# Patient Record
Sex: Female | Born: 1965 | Hispanic: No | Marital: Married | Smoking: Former smoker
Health system: Southern US, Community
[De-identification: ages and names within clinical notes are randomized; demographics above are authoritative.]

## PROBLEM LIST (undated history)

## (undated) DIAGNOSIS — F329 Major depressive disorder, single episode, unspecified: Secondary | ICD-10-CM

## (undated) DIAGNOSIS — M5430 Sciatica, unspecified side: Secondary | ICD-10-CM

## (undated) DIAGNOSIS — F32A Depression, unspecified: Secondary | ICD-10-CM

## (undated) DIAGNOSIS — I8390 Asymptomatic varicose veins of unspecified lower extremity: Secondary | ICD-10-CM

## (undated) DIAGNOSIS — E039 Hypothyroidism, unspecified: Secondary | ICD-10-CM

## (undated) HISTORY — PX: TONSILLECTOMY: SHX5217

## (undated) HISTORY — DX: Hypothyroidism, unspecified: E03.9

## (undated) HISTORY — DX: Sciatica, unspecified side: M54.30

## (undated) HISTORY — DX: Asymptomatic varicose veins of unspecified lower extremity: I83.90

## (undated) HISTORY — DX: Major depressive disorder, single episode, unspecified: F32.9

## (undated) HISTORY — PX: OTHER SURGICAL HISTORY: SHX169

## (undated) HISTORY — DX: Depression, unspecified: F32.A

## (undated) HISTORY — PX: EYE SURGERY: SHX253

---

## 1997-09-30 HISTORY — PX: VARICOSE VEIN SURGERY: SHX832

## 2003-06-11 ENCOUNTER — Other Ambulatory Visit: Admission: RE | Admit: 2003-06-11 | Discharge: 2003-06-11 | Payer: Self-pay | Admitting: Gynecology

## 2004-06-16 ENCOUNTER — Other Ambulatory Visit: Admission: RE | Admit: 2004-06-16 | Discharge: 2004-06-16 | Payer: Self-pay | Admitting: Gynecology

## 2004-06-24 ENCOUNTER — Ambulatory Visit (HOSPITAL_COMMUNITY): Admission: RE | Admit: 2004-06-24 | Discharge: 2004-06-24 | Payer: Self-pay | Admitting: Gynecology

## 2005-07-27 ENCOUNTER — Other Ambulatory Visit: Admission: RE | Admit: 2005-07-27 | Discharge: 2005-07-27 | Payer: Self-pay | Admitting: Gynecology

## 2005-08-10 ENCOUNTER — Ambulatory Visit (HOSPITAL_COMMUNITY): Admission: RE | Admit: 2005-08-10 | Discharge: 2005-08-10 | Payer: Self-pay | Admitting: Gynecology

## 2006-08-15 ENCOUNTER — Ambulatory Visit (HOSPITAL_COMMUNITY): Admission: RE | Admit: 2006-08-15 | Discharge: 2006-08-15 | Payer: Self-pay | Admitting: Obstetrics & Gynecology

## 2006-09-05 ENCOUNTER — Ambulatory Visit: Payer: Self-pay | Admitting: Vascular Surgery

## 2006-09-12 ENCOUNTER — Other Ambulatory Visit: Admission: RE | Admit: 2006-09-12 | Discharge: 2006-09-12 | Payer: Self-pay | Admitting: Gynecology

## 2006-12-19 ENCOUNTER — Ambulatory Visit: Payer: Self-pay | Admitting: Vascular Surgery

## 2007-01-16 ENCOUNTER — Ambulatory Visit: Payer: Self-pay | Admitting: Vascular Surgery

## 2007-01-23 ENCOUNTER — Ambulatory Visit: Payer: Self-pay | Admitting: Vascular Surgery

## 2007-02-06 ENCOUNTER — Ambulatory Visit: Payer: Self-pay | Admitting: Vascular Surgery

## 2007-09-04 ENCOUNTER — Ambulatory Visit (HOSPITAL_COMMUNITY): Admission: RE | Admit: 2007-09-04 | Discharge: 2007-09-04 | Payer: Self-pay | Admitting: Gynecology

## 2007-09-18 ENCOUNTER — Other Ambulatory Visit: Admission: RE | Admit: 2007-09-18 | Discharge: 2007-09-18 | Payer: Self-pay | Admitting: Gynecology

## 2008-06-19 ENCOUNTER — Ambulatory Visit: Payer: Self-pay | Admitting: Women's Health

## 2008-06-23 ENCOUNTER — Encounter: Admission: RE | Admit: 2008-06-23 | Discharge: 2008-06-23 | Payer: Self-pay | Admitting: Gynecology

## 2008-09-09 ENCOUNTER — Ambulatory Visit (HOSPITAL_COMMUNITY): Admission: RE | Admit: 2008-09-09 | Discharge: 2008-09-09 | Payer: Self-pay | Admitting: Gynecology

## 2008-09-23 ENCOUNTER — Other Ambulatory Visit: Admission: RE | Admit: 2008-09-23 | Discharge: 2008-09-23 | Payer: Self-pay | Admitting: Gynecology

## 2008-09-23 ENCOUNTER — Encounter: Payer: Self-pay | Admitting: Women's Health

## 2008-09-23 ENCOUNTER — Ambulatory Visit: Payer: Self-pay | Admitting: Women's Health

## 2009-01-06 ENCOUNTER — Encounter: Admission: RE | Admit: 2009-01-06 | Discharge: 2009-01-06 | Payer: Self-pay | Admitting: Family Medicine

## 2009-09-15 ENCOUNTER — Ambulatory Visit (HOSPITAL_COMMUNITY): Admission: RE | Admit: 2009-09-15 | Discharge: 2009-09-15 | Payer: Self-pay | Admitting: Gynecology

## 2009-10-20 ENCOUNTER — Ambulatory Visit: Payer: Self-pay | Admitting: Women's Health

## 2009-10-20 ENCOUNTER — Other Ambulatory Visit: Admission: RE | Admit: 2009-10-20 | Discharge: 2009-10-20 | Payer: Self-pay | Admitting: Gynecology

## 2009-12-22 ENCOUNTER — Ambulatory Visit: Payer: Self-pay | Admitting: Women's Health

## 2009-12-29 ENCOUNTER — Ambulatory Visit: Payer: Self-pay | Admitting: Gynecology

## 2010-01-05 ENCOUNTER — Ambulatory Visit: Payer: Self-pay | Admitting: Gynecology

## 2010-06-14 NOTE — Procedures (Signed)
LOWER EXTREMITY VENOUS REFLUX EXAM   INDICATION:  Right leg varicose vein with pain and swelling.   EXAM:  Using color-flow imaging and pulse Doppler spectral analysis, the  right common femoral, superficial femoral, popliteal, posterior tibial,  greater and lesser saphenous veins are evaluated.  There is no evidence  suggesting deep venous insufficiency in the right lower extremity.   The right saphenofemoral junction is competent.  The right GSV is not  competent with the caliber as described below.   The right proximal short saphenous vein demonstrates competency.   GSV Diameter (used if found to be incompetent only)                                            Right    Left  Proximal Greater Saphenous Vein           0.95 cm  cm  Proximal-to-mid-thigh                     0.58 cm  cm  Mid thigh                                 0.58 cm  cm  Mid-distal thigh                          0.42 cm  cm  Distal thigh                              0.42 cm  cm  Knee                                      0.36 cm  cm   IMPRESSION:  1. Right greater saphenous vein reflux is identified with the caliber      ranging from 0.36 cm to 0.95 cm knee to groin.  2. The right greater saphenous vein is not aneurysmal.  3. The right greater saphenous vein is not tortuous.  4. The deep venous system is competent.  5. The right lesser saphenous vein is competent.  6. No evidence of deep venous thrombosis noted in the right leg.   ___________________________________________    Gretta Began, MD   MG/MEDQ  D:  12/19/2006  T:  12/20/2006  Job:  [id number]

## 2010-06-14 NOTE — Assessment & Plan Note (Signed)
OFFICE VISIT   Dawn Macdonald, Dawn Macdonald  DOB:  [birth year]                                       02/06/2007  [id number]#:[id number]   The patient presents today for continued followup of her laser ablation  of the right greater saphenous vein and stab phlebectomies.  She has had  an excellent initial result.  Her procedure was on 01/16/2007.  She did  quite well with mild discomfort.  She has minimal bruising and no  discomfort with her stab phlebectomy sites.  Her limited venous duplex  today reveals closure of her saphenous vein from her knee to her  saphenofemoral junction.  Her common femoral vein was widely patent  without injury.  She will continue her usual activities and will wear  her compression garments on a p.r.n. basis, and see [place] again on an as  needed basis.   Larina Earthly, M.D.  Electronically Signed   TFE/MEDQ  D:  02/06/2007  T:  02/07/2007  Job:  [id number]   cc:   Ace Gins, MD

## 2010-06-14 NOTE — Assessment & Plan Note (Signed)
OFFICE VISIT   Dawn Macdonald, Dawn Macdonald  DOB:  1965-04-23                                       12/19/2006  IOXBD#:[id number]   SUBJECTIVE:  The patient presents today for continued followup of her  right leg venous pathology.  She has worn graduated compression  garments, thigh-high, 20-30 mm Hg, for 3 months and reports that this  has had no improvement in her difficulty with leg pain.  The patient  works as a Optometrist and stands for prolonged periods and has a  difficult time treating patients, secondary to leg pain.  When she is  working Editor, commissioning or caring for her children, she has to stop,  secondary to pain, and elevate her legs throughout the day, which is  quite disruptive.  She also has difficulty sleeping, secondary to leg  pain.  She does elevate her legs whenever possible and does take  ibuprofen 600 mg t.i.d. for pain and continues to have significant  problems, despite this.   PHYSICAL EXAMINATION:  There is no change.  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  She  underwent formal duplex evaluation today and this confirms reflex  throughout her right great saphenous vein, with no evidence of DVT or  deep valvular incompetence.  Her saphenous vein is not aneurysmal or  tortuous.   PLAN:  I have recommended that we proceed with right leg great saphenous  vein laser ablation and stab phlebectomy of tributary varicosities for  relief of her symptoms of severe venous  hypertension.  She understands the procedure is an outpatient and takes  approximately 1-1/2 hours for the procedure.  We will schedule this once  we have assured insurance coverage.   Larina Earthly, M.D.  Electronically Signed   TFE/MEDQ  D:  12/19/2006  T:  12/20/2006  Job:  [id number]   cc:   Ace Gins, MD

## 2010-06-14 NOTE — Consult Note (Signed)
NEW PATIENT CONSULTATION   Macdonald, Dawn  DOB:  [birth year]                                       09/05/2006  WUXLK#:[id number]   HISTORY OF PRESENT ILLNESS:  Dawn Macdonald presents today for  evaluation of severe venous pathology in her right leg.  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  She  stands for a prolonged period of time and is having increasingly severe  discomfort over her right leg saphenous varicosities.  She reports that  this is progressive throughout the day and has discomfort mostly in the  medial varicosities at the below-knee position.  She also has extensive  varicosities over the anterior thigh with some discomfort in these as  well.  She has had multiple episodes of superficial thrombophlebitis in  the varicosities below her knee.  She does have swelling at the ankle  level at the end of the day and generalized aching in her right leg at  the end of the day as well.  She does have a history of vein stripping  in her left leg and has had complete resolution of her symptoms.  She  does have several small reticular varicosities in her left leg.  She  wore compression hose in the past, but has not worn any recently.  She  does elevate her legs as much as possible and does use Motrin p.r.n. for  this discomfort as well.  Her health is otherwise excellent with no  major medical difficulties, specifically no deep venous thrombosis.   SOCIAL HISTORY:  She is married with 2 children, does not smoke and has  occasional alcohol consumption.   PHYSICAL EXAMINATION:  General:  She is a well-developed, well-nourished  white female appearing stated age of 45.  Vital Signs:  Blood pressure  is 118/76, pulse 76, respirations 16.  Her dorsalis pedis pulses are 2+  bilaterally.  The left leg is noted for a prior incision at the level of  her knee and several stab phlebectomy incisions and several small  reticular  varicosities in the medial thigh and medial calf.  On the  right leg, she has marked varicosities in the below-knee position in the  medial calf and also extended throughout her anterior thigh, lateral  knee and then down into her medial calf.  She underwent hand-held Duplex  by me, showing reflux in the varicosities and also reflux in her  saphenous vein.   ASSESSMENT AND RECOMMENDATION:  I discussed options with Dr. Eartha Inch.  I  have recommended that we resume graduated compression stockings to see  if this gives adequate relief.  I did explain the procedure of laser  ablation of her saphenous vein and stab phlebectomy as an advance over  the surgical treatment she had had nearly 10 years ago.  We will see her  back in 3 months to determine if her conservative treatment is effective  and she will also undergo a formal Duplex at that time.   Larina Earthly, M.D.  Electronically Signed   TFE/MEDQ  D:  09/05/2006  T:  09/06/2006  Job:  [id number]   cc:   Ace Gins, MD

## 2010-09-06 ENCOUNTER — Other Ambulatory Visit: Payer: Self-pay | Admitting: Women's Health

## 2010-09-06 DIAGNOSIS — Z1231 Encounter for screening mammogram for malignant neoplasm of breast: Secondary | ICD-10-CM

## 2010-09-21 ENCOUNTER — Ambulatory Visit (HOSPITAL_COMMUNITY)
Admission: RE | Admit: 2010-09-21 | Discharge: 2010-09-21 | Disposition: A | Discharge status: Home / Self Care | Payer: BC Managed Care – PPO | Source: Ambulatory Visit | Attending: Women's Health | Admitting: Women's Health

## 2010-09-21 DIAGNOSIS — Z1231 Encounter for screening mammogram for malignant neoplasm of breast: Secondary | ICD-10-CM | POA: Insufficient documentation

## 2010-10-31 DIAGNOSIS — I8390 Asymptomatic varicose veins of unspecified lower extremity: Secondary | ICD-10-CM | POA: Insufficient documentation

## 2010-10-31 DIAGNOSIS — E039 Hypothyroidism, unspecified: Secondary | ICD-10-CM | POA: Insufficient documentation

## 2010-11-02 ENCOUNTER — Ambulatory Visit (INDEPENDENT_AMBULATORY_CARE_PROVIDER_SITE_OTHER): Payer: BC Managed Care – PPO | Admitting: Women's Health

## 2010-11-02 ENCOUNTER — Encounter: Payer: Self-pay | Admitting: Women's Health

## 2010-11-02 ENCOUNTER — Other Ambulatory Visit (HOSPITAL_COMMUNITY)
Admission: RE | Admit: 2010-11-02 | Discharge: 2010-11-02 | Disposition: A | Discharge status: Home / Self Care | Payer: BC Managed Care – PPO | Source: Ambulatory Visit | Attending: Gynecology | Admitting: Gynecology

## 2010-11-02 VITALS — BP 104/70 | Ht 62.5 in | Wt 129.0 lb

## 2010-11-02 DIAGNOSIS — Z01419 Encounter for gynecological examination (general) (routine) without abnormal findings: Secondary | ICD-10-CM | POA: Insufficient documentation

## 2010-11-02 DIAGNOSIS — E039 Hypothyroidism, unspecified: Secondary | ICD-10-CM

## 2010-11-02 DIAGNOSIS — Z13220 Encounter for screening for lipoid disorders: Secondary | ICD-10-CM

## 2010-11-02 MED ORDER — LEVOTHYROXINE SODIUM 125 MCG PO TABS: 125.0000 ug | ORAL_TABLET | Freq: Every day | ORAL | Status: DC

## 2010-11-02 NOTE — Progress Notes (Signed)
Addended by: Landis Martins R on: 11/02/2010 04:14 PM   Modules accepted: Orders

## 2010-11-02 NOTE — Progress Notes (Signed)
Addended by: Landis Martins R on: 11/02/2010 04:09 PM   Modules accepted: Orders

## 2010-11-02 NOTE — Progress Notes (Signed)
Dawn Macdonald [birth year] [id number]    History:    The patient presents for annual exam.  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 47 year old son was a Consulting civil engineer at [employer], is now in alcohol rehabilitation and doing better. 76 year old son in high school is doing well.   Past medical history, past surgical history, family history and social history were all reviewed and documented in the EPIC chart.   ROS:  A  ROS was performed and pertinent positives and negatives are included in the history.  Exam:  Filed Vitals:   11/02/10 0856  BP: 104/70    General appearance:  Normal Head/Neck:  Normal, without cervical or supraclavicular adenopathy. Thyroid:  Symmetrical, enlarged, no change without palpable masses or nodularity. Respiratory  Effort:  Normal  Auscultation:  Clear without wheezing or rhonchi Cardiovascular  Auscultation:  Regular rate, without rubs, murmurs or gallops  Edema/varicosities:  Not grossly evident Abdominal  Soft,nontender, without masses, guarding or rebound.  Liver/spleen:  No organomegaly noted  Hernia:  None appreciated  Skin  Inspection:  Grossly normal  Palpation:  Grossly normal Neurologic/psychiatric  Orientation:  Normal with appropriate conversation.  Mood/affect:  Normal  Genitourinary    Breasts: Examined lying and sitting.     Right: Without masses, retractions, discharge or axillary adenopathy.     Left: Without masses, retractions, discharge or axillary adenopathy.   Inguinal/mons:  Normal without inguinal adenopathy  External genitalia:  Normal  BUS/Urethra/Skene's glands:  Normal  Bladder:  Normal  Vagina:  Normal  Cervix:  Normal  Uterus:   normal in size, shape and contour.  Midline and mobile  Adnexa/parametria:     Rt: Without masses or tenderness.   Lt: Without masses or tenderness.  Anus and perineum: Normal  Digital rectal exam: Normal sphincter tone without palpated masses or tenderness  Assessment/Plan:  45 y.o.  MWF G2P2 for annual exam postmenopausal with no bleeding, no HRT and no complaints. DEXA in 2011 showed osteoporosis  T score -2.8 at the spine. Discussed findings with Dr. Audie Box who did recommend bisphosphonate. She declined treatment. Reviewed rechecking DEXA this year we'll check with her insurance company to assure coverage. Did review importance of a calcium rich diet, vitamin D 2000 daily, and continue her exercise. Fall prevention and home safety was reviewed.  Postmenopausal with osteoporosis and hypothyroidism  Plan: TSH, CBC, lipid profile, UA and Pap. Synthroid 125 mcg, prescription proper use was given reviewed. Repeat DEXA this year. SBEs, annual mammogram which have been normal. Vitamin D 2000, fish oil daily and exercise encouraged.  Harrington Challenger WHNP, 10:00 AM 11/02/2010

## 2010-11-03 ENCOUNTER — Other Ambulatory Visit: Payer: Self-pay | Admitting: Women's Health

## 2010-11-03 DIAGNOSIS — E039 Hypothyroidism, unspecified: Secondary | ICD-10-CM

## 2010-11-03 MED ORDER — LEVOTHYROXINE SODIUM 112 MCG PO TABS: 112.0000 ug | ORAL_TABLET | Freq: Every day | ORAL | Status: DC

## 2011-02-01 ENCOUNTER — Ambulatory Visit: Payer: BC Managed Care – PPO | Admitting: Women's Health

## 2011-02-01 ENCOUNTER — Telehealth: Payer: Self-pay | Admitting: Women's Health

## 2011-02-01 DIAGNOSIS — E039 Hypothyroidism, unspecified: Secondary | ICD-10-CM

## 2011-02-01 LAB — TSH: TSH: 3.713 u[IU]/mL (ref 0.350–4.500)

## 2011-02-01 MED ORDER — LEVOTHYROXINE SODIUM 112 MCG PO TABS: 112.0000 ug | ORAL_TABLET | Freq: Every day | ORAL | Status: DC

## 2011-02-01 NOTE — Telephone Encounter (Signed)
Telephone call to discuss normal TSH results of 3.713, prior was 0.255. Currently on Synthroid 112 and will continue.

## 2011-09-25 ENCOUNTER — Other Ambulatory Visit: Payer: Self-pay | Admitting: Women's Health

## 2011-09-25 DIAGNOSIS — Z1231 Encounter for screening mammogram for malignant neoplasm of breast: Secondary | ICD-10-CM

## 2011-10-11 ENCOUNTER — Ambulatory Visit (HOSPITAL_COMMUNITY)
Admission: RE | Admit: 2011-10-11 | Discharge: 2011-10-11 | Disposition: A | Discharge status: Home / Self Care | Payer: BC Managed Care – PPO | Source: Ambulatory Visit | Attending: Women's Health | Admitting: Women's Health

## 2011-10-11 DIAGNOSIS — Z1231 Encounter for screening mammogram for malignant neoplasm of breast: Secondary | ICD-10-CM | POA: Insufficient documentation

## 2011-11-08 ENCOUNTER — Encounter: Payer: Self-pay | Admitting: Women's Health

## 2011-11-08 ENCOUNTER — Ambulatory Visit (INDEPENDENT_AMBULATORY_CARE_PROVIDER_SITE_OTHER): Payer: BC Managed Care – PPO | Admitting: Women's Health

## 2011-11-08 VITALS — BP 102/66 | Ht 63.0 in | Wt 134.0 lb

## 2011-11-08 DIAGNOSIS — M810 Age-related osteoporosis without current pathological fracture: Secondary | ICD-10-CM

## 2011-11-08 DIAGNOSIS — E039 Hypothyroidism, unspecified: Secondary | ICD-10-CM

## 2011-11-08 DIAGNOSIS — Z13220 Encounter for screening for lipoid disorders: Secondary | ICD-10-CM

## 2011-11-08 DIAGNOSIS — Z01419 Encounter for gynecological examination (general) (routine) without abnormal findings: Secondary | ICD-10-CM

## 2011-11-08 LAB — CBC WITH DIFFERENTIAL/PLATELET
Basophils Absolute: 0 10*3/uL (ref 0.0–0.1)
Basophils Relative: 0 % (ref 0–1)
Eosinophils Absolute: 0.1 10*3/uL (ref 0.0–0.7)
Eosinophils Relative: 1 % (ref 0–5)
HCT: 41.1 % (ref 36.0–46.0)
Hemoglobin: 13.3 g/dL (ref 12.0–15.0)
MCV: 90.1 fL (ref 78.0–100.0)
Monocytes Absolute: 0.4 10*3/uL (ref 0.1–1.0)
RBC: 4.56 MIL/uL (ref 3.87–5.11)
RDW: 12.7 % (ref 11.5–15.5)
WBC: 4.6 10*3/uL (ref 4.0–10.5)

## 2011-11-08 LAB — TSH: TSH: 2.558 u[IU]/mL (ref 0.350–4.500)

## 2011-11-08 MED ORDER — LEVOTHYROXINE SODIUM 112 MCG PO TABS: 112.0000 ug | ORAL_TABLET | Freq: Every day | ORAL | Status: DC

## 2011-11-08 NOTE — Progress Notes (Signed)
Dawn Macdonald [birth year] [id number]    History:    The patient presents for annual exam.  Postmenopausal no bleeding or HRT. Hypothyroid on Synthroid 112 mcg daily. History of osteoporosis, T score of the spine -2.8, T score of bilateral hip average -1.1. Declined medication in 2011. History of normal Paps and mammograms. Had a normal colonoscopy in 2010. History of elevated cholesterol, avid exerciser and healthy diet, 2012 cholesterol 253, LDL 152, triglycerides 45, HDL 82.  Seeing a psychiatrist and therapist for situational depression, currently on Lexapro and doing better.   Past medical history, past surgical history, family history and social history were all reviewed and documented in the EPIC chart. Pediatrician. 2 sons one age 26 had been at [facility] currently in drug rehabilitation doing much better. 75 year old son doing well.  ROS:  A  ROS was performed and pertinent positives and negatives are included in the history.  Exam:  Filed Vitals:   11/08/11 0843  BP: 102/66    General appearance:  Normal Head/Neck:  Normal, without cervical or supraclavicular adenopathy. Thyroid:  Symmetrical, normal in size, without palpable masses or nodularity. Respiratory  Effort:  Normal  Auscultation:  Clear without wheezing or rhonchi Cardiovascular  Auscultation:  Regular rate, without rubs, murmurs or gallops  Edema/varicosities:  Not grossly evident Abdominal  Soft,nontender, without masses, guarding or rebound.  Liver/spleen:  No organomegaly noted  Hernia:  None appreciated  Skin  Inspection:  Grossly normal  Palpation:  Grossly normal Neurologic/psychiatric  Orientation:  Normal with appropriate conversation.  Mood/affect:  Normal  Genitourinary    Breasts: Examined lying and sitting.     Right: Without masses, retractions, discharge or axillary adenopathy.     Left: Without masses, retractions, discharge or axillary adenopathy.   Inguinal/mons:  Normal without  inguinal adenopathy  External genitalia:  Normal  BUS/Urethra/Skene's glands:  Normal  Bladder:  Normal  Vagina:  Normal  Cervix:  Normal  Uterus:  normal in size, shape and contour.  Midline and mobile  Adnexa/parametria:     Rt: Without masses or tenderness.   Lt: Without masses or tenderness.  Anus and perineum: Normal  Digital rectal exam: Normal sphincter tone without palpated masses or tenderness  Assessment/Plan:  46 y.o. M. WF G3 P2  for annual exam.     Hypothyroid/Synthroid 112 mcg Osteoporosis T score of -2.8 AP spine 2011 Situational anxiety/depression-psychiatrist/counseling  Lexapro 10 daily  Plan: Repeat DEXA, will schedule, evaluate for possible medication therapy. SBE's, continue annual mammogram, calcium rich diet, vitamin D 2000 daily, continue regular exercise. Synthroid 112 mcg daily, prescription proper use given and reviewed. CBC, lipid panel, UA. No Pap history of normal Paps new screening guidelines reviewed. Continue counseling as needed and Lexapro 10 daily as prescribed by psychiatrist.    Harrington Challenger [facility], 9:35 AM 11/08/2011

## 2011-11-08 NOTE — Patient Instructions (Signed)

## 2011-11-09 LAB — URINALYSIS W MICROSCOPIC + REFLEX CULTURE
Bacteria, UA: NONE SEEN
Crystals: NONE SEEN
Glucose, UA: NEGATIVE mg/dL
Hgb urine dipstick: NEGATIVE
Nitrite: NEGATIVE
Specific Gravity, Urine: 1.016 (ref 1.005–1.030)
Squamous Epithelial / LPF: NONE SEEN
pH: 7 (ref 5.0–8.0)

## 2011-11-14 ENCOUNTER — Encounter: Payer: Self-pay | Admitting: Gynecology

## 2012-01-03 ENCOUNTER — Ambulatory Visit (INDEPENDENT_AMBULATORY_CARE_PROVIDER_SITE_OTHER): Payer: BC Managed Care – PPO

## 2012-01-03 ENCOUNTER — Other Ambulatory Visit: Payer: Self-pay | Admitting: Gynecology

## 2012-01-03 DIAGNOSIS — M810 Age-related osteoporosis without current pathological fracture: Secondary | ICD-10-CM

## 2012-01-05 ENCOUNTER — Telehealth: Payer: Self-pay | Admitting: Gynecology

## 2012-01-05 ENCOUNTER — Encounter: Payer: Self-pay | Admitting: Gynecology

## 2012-01-05 NOTE — Telephone Encounter (Signed)
Patient advised.  She asked if she really has to come in for discussion.  She said you had "the talk" with her two years ago when Dexa showed osteoporosis but she was just not ready then to start on medication.  She is ready now to start and said she is fine with you just prescribing what you commonly do "that once a month medicine".  I did advise her that you wanted to check blood work and she said she did not have her schedule handy but will call for a lab appointment and come get that drawn.  She asked me to check with you and if you really needed to see her again she will make an appointment.

## 2012-01-05 NOTE — Telephone Encounter (Signed)
Tell patient that her DEXA shows osteoporosis. I would like to talk to her about treatment options. Schedule appointment with her. I would also like to check her vitamin D, PTH and comprehensive metabolic panel. She can do that when she comes in to talk to me or before hand at her choice.

## 2012-01-05 NOTE — Telephone Encounter (Signed)
Left message cell phone voice mail for patient to call.

## 2012-01-08 NOTE — Telephone Encounter (Signed)
A discussion several years ago doesn't suffice to start medication now. We'll need to rediscuss certain issues as I need to know that she has a clear understanding of the risks/ benefits. Ask her to schedule a consult appointment with me. She can do her blood work at that point.

## 2012-01-08 NOTE — Telephone Encounter (Signed)
Left detailed message on her voice mail as per her request.

## 2012-01-17 ENCOUNTER — Other Ambulatory Visit: Payer: Self-pay | Admitting: *Deleted

## 2012-01-17 DIAGNOSIS — M810 Age-related osteoporosis without current pathological fracture: Secondary | ICD-10-CM

## 2012-02-08 ENCOUNTER — Encounter: Payer: Self-pay | Admitting: Women's Health

## 2012-02-09 ENCOUNTER — Other Ambulatory Visit: Payer: Self-pay | Admitting: Women's Health

## 2012-02-09 MED ORDER — LEVOTHYROXINE SODIUM 112 MCG PO TABS: 112.0000 ug | ORAL_TABLET | Freq: Every day | ORAL | Status: DC

## 2012-02-13 ENCOUNTER — Telehealth: Payer: Self-pay | Admitting: *Deleted

## 2012-02-13 MED ORDER — LEVOTHYROXINE SODIUM 112 MCG PO TABS: 112.0000 ug | ORAL_TABLET | Freq: Every day | ORAL | Status: DC

## 2012-02-13 NOTE — Addendum Note (Signed)
Addended by: Aura Camps on: 02/13/2012 11:02 AM   Modules accepted: Orders

## 2012-02-13 NOTE — Telephone Encounter (Signed)
Pt has new pharmacy [facility] mail order, requesting her Levothyroxin sent to this pharmacy. Rx sent with 90 day supply with refills.

## 2012-02-14 ENCOUNTER — Ambulatory Visit (INDEPENDENT_AMBULATORY_CARE_PROVIDER_SITE_OTHER): Payer: BC Managed Care – PPO | Admitting: Gynecology

## 2012-02-14 ENCOUNTER — Encounter: Payer: Self-pay | Admitting: Gynecology

## 2012-02-14 DIAGNOSIS — M810 Age-related osteoporosis without current pathological fracture: Secondary | ICD-10-CM

## 2012-02-14 LAB — COMPREHENSIVE METABOLIC PANEL
ALT: 12 U/L (ref 0–35)
Albumin: 4.4 g/dL (ref 3.5–5.2)
Alkaline Phosphatase: 53 U/L (ref 39–117)
CO2: 26 mEq/L (ref 19–32)
Glucose, Bld: 92 mg/dL (ref 70–99)
Potassium: 4.3 mEq/L (ref 3.5–5.3)
Sodium: 139 mEq/L (ref 135–145)
Total Bilirubin: 0.5 mg/dL (ref 0.3–1.2)
Total Protein: 6.5 g/dL (ref 6.0–8.3)

## 2012-02-14 NOTE — Patient Instructions (Signed)
Follow up for blood work results.

## 2012-02-14 NOTE — Progress Notes (Signed)
Patient presents to discuss her osteoporosis. Recent DEXA shows T score -2.9. This is stable from her prior study 2011. We had talked previously about treatment options and she had declined treatment.  In review of her history she does have a history of thyroid dysfunction where she had great swings in her thyroid functioning where she was hyperthyroid and hypothyroid. She has no history of eating disorders. Is not being followed for any other significant medical diseases. I ordered a vitamin D PTH and comprehensive metabolic panel today. She does have her recent TSH which is normal. I again reviewed options for treatment now to include bisphosphonates and alternatives. The side effect profile to include GERD, esophageal disease up to and including carcinoma, osteonecrosis of the jaw atypical fractures particularly with prolonged use reviewed. The issue of fracture risk at age 21 versus treatment now and then the issue as to how long you should treat her and then drug-free holiday particularly noting that she is stable from her prior study 2 years ago. After a lengthy discussion and probability that her prior thyroid dysfunction contributed to her bone loss which is now controlled that of all of her blood studies are normal the patient would prefer continuing with her weightbearing exercise calcium vitamin D and rechecking her density in 2 years. I certainly think this is a reasonable approach given her total picture. The question is at what point we start treatment as she ages from a fracture protection risk and reviewed our understanding and treatment options are dramatically changing over time and that we'll readdress is that she gets older.

## 2012-02-15 LAB — PTH, INTACT AND CALCIUM: Calcium, Total (PTH): 9.3 mg/dL (ref 8.4–10.5)

## 2012-03-16 ENCOUNTER — Other Ambulatory Visit: Payer: Self-pay

## 2012-10-01 ENCOUNTER — Other Ambulatory Visit: Payer: Self-pay | Admitting: Gynecology

## 2012-10-01 DIAGNOSIS — Z1231 Encounter for screening mammogram for malignant neoplasm of breast: Secondary | ICD-10-CM

## 2012-10-23 ENCOUNTER — Ambulatory Visit (HOSPITAL_COMMUNITY)
Admission: RE | Admit: 2012-10-23 | Discharge: 2012-10-23 | Disposition: A | Discharge status: Home / Self Care | Payer: BC Managed Care – PPO | Source: Ambulatory Visit | Attending: Gynecology | Admitting: Gynecology

## 2012-10-23 DIAGNOSIS — Z1231 Encounter for screening mammogram for malignant neoplasm of breast: Secondary | ICD-10-CM | POA: Insufficient documentation

## 2012-10-28 ENCOUNTER — Other Ambulatory Visit: Payer: Self-pay | Admitting: Gynecology

## 2012-10-28 DIAGNOSIS — N63 Unspecified lump in unspecified breast: Secondary | ICD-10-CM

## 2012-11-13 ENCOUNTER — Ambulatory Visit
Admission: RE | Admit: 2012-11-13 | Discharge: 2012-11-13 | Disposition: A | Discharge status: Home / Self Care | Payer: BC Managed Care – PPO | Source: Ambulatory Visit | Attending: Gynecology | Admitting: Gynecology

## 2012-11-13 ENCOUNTER — Encounter: Payer: Self-pay | Admitting: Women's Health

## 2012-11-13 ENCOUNTER — Ambulatory Visit (INDEPENDENT_AMBULATORY_CARE_PROVIDER_SITE_OTHER): Payer: BC Managed Care – PPO | Admitting: Women's Health

## 2012-11-13 ENCOUNTER — Other Ambulatory Visit (HOSPITAL_COMMUNITY)
Admission: RE | Admit: 2012-11-13 | Discharge: 2012-11-13 | Disposition: A | Discharge status: Home / Self Care | Payer: BC Managed Care – PPO | Source: Ambulatory Visit | Attending: Gynecology | Admitting: Gynecology

## 2012-11-13 VITALS — BP 102/68 | Ht 62.0 in | Wt 130.0 lb

## 2012-11-13 DIAGNOSIS — E039 Hypothyroidism, unspecified: Secondary | ICD-10-CM

## 2012-11-13 DIAGNOSIS — Z01419 Encounter for gynecological examination (general) (routine) without abnormal findings: Secondary | ICD-10-CM | POA: Insufficient documentation

## 2012-11-13 DIAGNOSIS — N63 Unspecified lump in unspecified breast: Secondary | ICD-10-CM

## 2012-11-13 LAB — CBC WITH DIFFERENTIAL/PLATELET
HCT: 35.9 % — ABNORMAL LOW (ref 36.0–46.0)
Hemoglobin: 12.2 g/dL (ref 12.0–15.0)
Lymphocytes Relative: 38 % (ref 12–46)
Lymphs Abs: 1.9 10*3/uL (ref 0.7–4.0)
MCH: 29 pg (ref 26.0–34.0)
MCHC: 34 g/dL (ref 30.0–36.0)
Monocytes Absolute: 0.4 10*3/uL (ref 0.1–1.0)
Monocytes Relative: 9 % (ref 3–12)
Neutro Abs: 2.5 10*3/uL (ref 1.7–7.7)
Neutrophils Relative %: 52 % (ref 43–77)
RBC: 4.2 MIL/uL (ref 3.87–5.11)

## 2012-11-13 MED ORDER — LEVOTHYROXINE SODIUM 112 MCG PO TABS: 112.0000 ug | ORAL_TABLET | Freq: Every day | ORAL | Status: DC

## 2012-11-13 NOTE — Patient Instructions (Signed)
Health Recommendations for Postmenopausal Women Respected and ongoing research has looked at the most common causes of death, disability, and poor quality of life in postmenopausal women. The causes include heart disease, diseases of blood vessels, diabetes, depression, cancer, and bone loss (osteoporosis). Many things can be done to help lower the chances of developing these and other common problems: CARDIOVASCULAR DISEASE Heart Disease: A heart attack is a medical emergency. Know the signs and symptoms of a heart attack. Below are things women can do to reduce their risk for heart disease.   Do not smoke. If you smoke, quit.  Aim for a healthy weight. Being overweight causes many preventable deaths. Eat a healthy and balanced diet and drink an adequate amount of liquids.  Get moving. Make a commitment to be more physically active. Aim for 30 minutes of activity on most, if not all days of the week.  Eat for heart health. Choose a diet that is low in saturated fat and cholesterol and eliminate trans fat. Include whole grains, vegetables, and fruits. Read and understand the labels on food containers before buying.  Know your numbers. Ask your caregiver to check your blood pressure, cholesterol (total, HDL, LDL, triglycerides) and blood glucose. Work with your caregiver on improving your entire clinical picture.  High blood pressure. Limit or stop your table salt intake (try salt substitute and food seasonings). Avoid salty foods and drinks. Read labels on food containers before buying. Eating well and exercising can help control high blood pressure. STROKE  Stroke is a medical emergency. Stroke may be the result of a blood clot in a blood vessel in the brain or by a brain hemorrhage (bleeding). Know the signs and symptoms of a stroke. To lower the risk of developing a stroke:  Avoid fatty foods.  Quit smoking.  Control your diabetes, blood pressure, and irregular heart rate. THROMBOPHLEBITIS  (BLOOD CLOT) OF THE LEG  Becoming overweight and leading a stationary lifestyle may also contribute to developing blood clots. Controlling your diet and exercising will help lower the risk of developing blood clots. CANCER SCREENING  Breast Cancer: Take steps to reduce your risk of breast cancer.  You should practice "breast self-awareness." This means understanding the normal appearance and feel of your breasts and should include breast self-examination. Any changes detected, no matter how small, should be reported to your caregiver.  After age 40, you should have a clinical breast exam (CBE) every year.  Starting at age 40, you should consider having a mammogram (breast X-ray) every year.  If you have a family history of breast cancer, talk to your caregiver about genetic screening.  If you are at high risk for breast cancer, talk to your caregiver about having an MRI and a mammogram every year.  Intestinal or Stomach Cancer: Tests to consider are a rectal exam, fecal occult blood, sigmoidoscopy, and colonoscopy. Women who are high risk may need to be screened at an earlier age and more often.  Cervical Cancer:  Beginning at age 30, you should have a Pap test every 3 years as long as the past 3 Pap tests have been normal.  If you have had past treatment for cervical cancer or a condition that could lead to cancer, you need Pap tests and screening for cancer for at least 20 years after your treatment.  If you had a hysterectomy for a problem that was not cancer or a condition that could lead to cancer, then you no longer need Pap tests.    If you are between ages 65 and 70, and you have had normal Pap tests going back 10 years, you no longer need Pap tests.  If Pap tests have been discontinued, risk factors (such as a new sexual partner) need to be reassessed to determine if screening should be resumed.  Some medical problems can increase the chance of getting cervical cancer. In these  cases, your caregiver may recommend more frequent screening and Pap tests.  Uterine Cancer: If you have vaginal bleeding after reaching menopause, you should notify your caregiver.  Ovarian cancer: Other than yearly pelvic exams, there are no reliable tests available to screen for ovarian cancer at this time except for yearly pelvic exams.  Lung Cancer: Yearly chest X-rays can detect lung cancer and should be done on high risk women, such as cigarette smokers and women with chronic lung disease (emphysema).  Skin Cancer: A complete body skin exam should be done at your yearly examination. Avoid overexposure to the sun and ultraviolet light lamps. Use a strong sun block cream when in the sun. All of these things are important in lowering the risk of skin cancer. MENOPAUSE Menopause Symptoms: Hormone therapy products are effective for treating symptoms associated with menopause:  Moderate to severe hot flashes.  Night sweats.  Mood swings.  Headaches.  Tiredness.  Loss of sex drive.  Insomnia.  Other symptoms. Hormone replacement carries certain risks, especially in older women. Women who use or are thinking about using estrogen or estrogen with progestin treatments should discuss that with their caregiver. Your caregiver will help you understand the benefits and risks. The ideal dose of hormone replacement therapy is not known. The Food and Drug Administration (FDA) has concluded that hormone therapy should be used only at the lowest doses and for the shortest amount of time to reach treatment goals.  OSTEOPOROSIS Protecting Against Bone Loss and Preventing Fracture: If you use hormone therapy for prevention of bone loss (osteoporosis), the risks for bone loss must outweigh the risk of the therapy. Ask your caregiver about other medications known to be safe and effective for preventing bone loss and fractures. To guard against bone loss or fractures, the following is recommended:  If  you are less than age 50, take 1000 mg of calcium and at least 600 mg of Vitamin D per day.  If you are greater than age 50 but less than age 70, take 1200 mg of calcium and at least 600 mg of Vitamin D per day.  If you are greater than age 70, take 1200 mg of calcium and at least 800 mg of Vitamin D per day. Smoking and excessive alcohol intake increases the risk of osteoporosis. Eat foods rich in calcium and vitamin D and do weight bearing exercises several times a week as your caregiver suggests. DIABETES Diabetes Melitus: If you have Type I or Type 2 diabetes, you should keep your blood sugar under control with diet, exercise and recommended medication. Avoid too many sweets, starchy and fatty foods. Being overweight can make control more difficult. COGNITION AND MEMORY Cognition and Memory: Menopausal hormone therapy is not recommended for the prevention of cognitive disorders such as Alzheimer's disease or memory loss.  DEPRESSION  Depression may occur at any age, but is common in elderly women. The reasons may be because of physical, medical, social (loneliness), or financial problems and needs. If you are experiencing depression because of medical problems and control of symptoms, talk to your caregiver about this. Physical activity and   exercise may help with mood and sleep. Community and volunteer involvement may help your sense of value and worth. If you have depression and you feel that the problem is getting worse or becoming severe, talk to your caregiver about treatment options that are best for you. ACCIDENTS  Accidents are common and can be serious in the elderly woman. Prepare your house to prevent accidents. Eliminate throw rugs, place hand bars in the bath, shower and toilet areas. Avoid wearing high heeled shoes or walking on wet, snowy, and icy areas. Limit or stop driving if you have vision or hearing problems, or you feel you are unsteady with you movements and  reflexes. HEPATITIS C Hepatitis C is a type of viral infection affecting the liver. It is spread mainly through contact with blood from an infected person. It can be treated, but if left untreated, it can lead to severe liver damage over years. Many people who are infected do not know that the virus is in their blood. If you are a "baby-boomer", it is recommended that you have one screening test for Hepatitis C. IMMUNIZATIONS  Several immunizations are important to consider having during your senior years, including:   Tetanus, diptheria, and pertussis booster shot.  Influenza every year before the flu season begins.  Pneumonia vaccine.  Shingles vaccine.  Others as indicated based on your specific needs. Talk to your caregiver about these. Document Released: 03/10/2005 Document Revised: 01/03/2012 Document Reviewed: 11/04/2007 [employer] Patient Information 2014 [employer].  

## 2012-11-13 NOTE — Addendum Note (Signed)
Addended by: Bertram Savin A on: 11/13/2012 04:06 PM   Modules accepted: Orders

## 2012-11-13 NOTE — Progress Notes (Signed)
Dawn Macdonald 12-01-1965 [id number]    History:    The patient presents for annual exam.  Is menopausal with no bleeding/no HRT. 2011  Osteoporosis T score -2.8 at spine, hip average -1.1 did not want to start on medication wanted to treat with exercise and vitamin D. Normal Pap history mammogram with right breast followup negative questionable cyst has a six-month ultrasound followup scheduled. Negative colonoscopy for years ago when having GI problems. On Lexapro 10 mg per psychiatrist for situational stress. Hypothyroid on Synthroid. 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  ROS:  A  ROS was performed and pertinent positives and negatives are included in the history.  Exam:  Filed Vitals:   11/13/12 1511  BP: 102/68    General appearance:  Normal Head/Neck:  Normal, without cervical or supraclavicular adenopathy. Thyroid:  Symmetrical, normal in size, without palpable masses or nodularity. Respiratory  Effort:  Normal  Auscultation:  Clear without wheezing or rhonchi Cardiovascular  Auscultation:  Regular rate, without rubs, murmurs or gallops  Edema/varicosities:  Not grossly evident Abdominal  Soft,nontender, without masses, guarding or rebound.  Liver/spleen:  No organomegaly noted  Hernia:  None appreciated  Skin  Inspection:  Grossly normal  Palpation:  Grossly normal Neurologic/psychiatric  Orientation:  Normal with appropriate conversation.  Mood/affect:  Normal  Genitourinary    Breasts: Examined lying and sitting.     Right: Without masses, retractions, discharge or axillary  adenopathy.     Left: Without masses, retractions, discharge or axillary adenopathy.   Inguinal/mons:  Normal without inguinal adenopathy  External genitalia:  Normal  BUS/Urethra/Skene's glands:  Normal  Bladder:  Normal  Vagina:  Normal  Cervix:  Normal  Uterus:   normal in size, shape and contour.  Midline and mobile  Adnexa/parametria:     Rt: Without masses or tenderness.   Lt: Without masses or tenderness.  Anus and perineum: Normal  Digital rectal exam: Normal sphincter tone without palpated masses or tenderness  Assessment/Plan:  47 y.o. MWF G2P2 for annual exam.    Depression stable on Lexapro/psychiatrist manages.  Hypothyroid Osteoporosis T score -2.8 spine bilateral hip -1.1 (2011)  Plan: CBC, TSH, UA, Pap. Pap normal 2012, new screening guidelines reviewed. Synthroid 112 prescription, proper use given and reviewed. SBE's, continue annual mammogram and followup ultrasound in 6 months. Continue counseling and medication per psychiatrist. Continue active lifestyle of exercise, vitamin D 2000 daily encouraged. Home safety and fall prevention discussed. Repeat DEXA. Medications reviewed. Declines.    Harrington Challenger [facility], 3:48 PM 11/13/2012

## 2012-11-14 LAB — URINALYSIS W MICROSCOPIC + REFLEX CULTURE
Bilirubin Urine: NEGATIVE
Casts: NONE SEEN
Crystals: NONE SEEN
Glucose, UA: NEGATIVE mg/dL
Leukocytes, UA: NEGATIVE
Specific Gravity, Urine: 1.02 (ref 1.005–1.030)
Squamous Epithelial / LPF: NONE SEEN
pH: 7.5 (ref 5.0–8.0)

## 2012-11-14 LAB — TSH: TSH: 2.13 u[IU]/mL (ref 0.350–4.500)

## 2012-12-05 ENCOUNTER — Other Ambulatory Visit: Payer: Self-pay

## 2013-04-08 ENCOUNTER — Other Ambulatory Visit: Payer: Self-pay | Admitting: Gynecology

## 2013-04-08 DIAGNOSIS — N63 Unspecified lump in unspecified breast: Secondary | ICD-10-CM

## 2013-05-21 ENCOUNTER — Ambulatory Visit
Admission: RE | Admit: 2013-05-21 | Discharge: 2013-05-21 | Disposition: A | Discharge status: Home / Self Care | Payer: BC Managed Care – PPO | Source: Ambulatory Visit | Attending: Gynecology | Admitting: Gynecology

## 2013-05-21 DIAGNOSIS — N63 Unspecified lump in unspecified breast: Secondary | ICD-10-CM

## 2013-10-20 ENCOUNTER — Other Ambulatory Visit: Payer: Self-pay | Admitting: Gynecology

## 2013-10-20 DIAGNOSIS — N63 Unspecified lump in unspecified breast: Secondary | ICD-10-CM

## 2013-11-12 ENCOUNTER — Ambulatory Visit
Admission: RE | Admit: 2013-11-12 | Discharge: 2013-11-12 | Disposition: A | Discharge status: Home / Self Care | Payer: BC Managed Care – PPO | Source: Ambulatory Visit | Attending: Gynecology | Admitting: Gynecology

## 2013-11-12 DIAGNOSIS — N63 Unspecified lump in unspecified breast: Secondary | ICD-10-CM

## 2013-11-14 ENCOUNTER — Other Ambulatory Visit: Payer: Self-pay

## 2013-11-19 ENCOUNTER — Encounter: Payer: Self-pay | Admitting: Women's Health

## 2013-11-19 ENCOUNTER — Ambulatory Visit (INDEPENDENT_AMBULATORY_CARE_PROVIDER_SITE_OTHER): Payer: BC Managed Care – PPO | Admitting: Women's Health

## 2013-11-19 VITALS — BP 120/70 | Ht 62.0 in | Wt 129.0 lb

## 2013-11-19 DIAGNOSIS — Z13220 Encounter for screening for lipoid disorders: Secondary | ICD-10-CM

## 2013-11-19 DIAGNOSIS — Z01419 Encounter for gynecological examination (general) (routine) without abnormal findings: Secondary | ICD-10-CM

## 2013-11-19 DIAGNOSIS — E038 Other specified hypothyroidism: Secondary | ICD-10-CM

## 2013-11-19 LAB — URINALYSIS W MICROSCOPIC + REFLEX CULTURE
Bacteria, UA: NONE SEEN
Bilirubin Urine: NEGATIVE
CRYSTALS: NONE SEEN
Casts: NONE SEEN
Glucose, UA: NEGATIVE mg/dL
Hgb urine dipstick: NEGATIVE
Ketones, ur: NEGATIVE mg/dL
Leukocytes, UA: NEGATIVE
NITRITE: NEGATIVE
Protein, ur: NEGATIVE mg/dL
SPECIFIC GRAVITY, URINE: 1.012 (ref 1.005–1.030)
SQUAMOUS EPITHELIAL / LPF: NONE SEEN
UROBILINOGEN UA: 0.2 mg/dL (ref 0.0–1.0)
pH: 6 (ref 5.0–8.0)

## 2013-11-19 LAB — CBC WITH DIFFERENTIAL/PLATELET
Basophils Absolute: 0 10*3/uL (ref 0.0–0.1)
Basophils Relative: 0 % (ref 0–1)
EOS ABS: 0.1 10*3/uL (ref 0.0–0.7)
Eosinophils Relative: 2 % (ref 0–5)
HCT: 39.4 % (ref 36.0–46.0)
HEMOGLOBIN: 12.9 g/dL (ref 12.0–15.0)
LYMPHS ABS: 1.7 10*3/uL (ref 0.7–4.0)
Lymphocytes Relative: 36 % (ref 12–46)
MCH: 29.2 pg (ref 26.0–34.0)
MCHC: 32.7 g/dL (ref 30.0–36.0)
MCV: 89.1 fL (ref 78.0–100.0)
MONOS PCT: 10 % (ref 3–12)
Monocytes Absolute: 0.5 10*3/uL (ref 0.1–1.0)
NEUTROS ABS: 2.4 10*3/uL (ref 1.7–7.7)
Neutrophils Relative %: 52 % (ref 43–77)
Platelets: 201 10*3/uL (ref 150–400)
RBC: 4.42 MIL/uL (ref 3.87–5.11)
RDW: 12.8 % (ref 11.5–15.5)
WBC: 4.6 10*3/uL (ref 4.0–10.5)

## 2013-11-19 LAB — COMPREHENSIVE METABOLIC PANEL
ALBUMIN: 4 g/dL (ref 3.5–5.2)
ALK PHOS: 50 U/L (ref 39–117)
ALT: 11 U/L (ref 0–35)
AST: 15 U/L (ref 0–37)
BUN: 15 mg/dL (ref 6–23)
CO2: 29 meq/L (ref 19–32)
Calcium: 9.3 mg/dL (ref 8.4–10.5)
Chloride: 103 mEq/L (ref 96–112)
Creat: 0.75 mg/dL (ref 0.50–1.10)
GLUCOSE: 61 mg/dL — AB (ref 70–99)
POTASSIUM: 4.7 meq/L (ref 3.5–5.3)
SODIUM: 139 meq/L (ref 135–145)
TOTAL PROTEIN: 6.5 g/dL (ref 6.0–8.3)
Total Bilirubin: 0.4 mg/dL (ref 0.2–1.2)

## 2013-11-19 LAB — LIPID PANEL
CHOLESTEROL: 236 mg/dL — AB (ref 0–200)
HDL: 78 mg/dL (ref >39)
LDL Cholesterol: 147 mg/dL — ABNORMAL HIGH (ref 0–99)
TRIGLYCERIDES: 56 mg/dL (ref <150)
Total CHOL/HDL Ratio: 3 Ratio
VLDL: 11 mg/dL (ref 0–40)

## 2013-11-19 LAB — TSH: TSH: 3.452 u[IU]/mL (ref 0.350–4.500)

## 2013-11-19 MED ORDER — LEVOTHYROXINE SODIUM 112 MCG PO TABS: 112.0000 ug | ORAL_TABLET | Freq: Every day | ORAL | Status: DC

## 2013-11-19 NOTE — Progress Notes (Signed)
Jay Schlichterkaterina Prell 1965-09-16 [id number]    History:    Presents for annual exam.  Postmenopausal/no HRT/no bleeding. Osteoporosis 12/2011  T score -2.9 stable at spine, will continue regular exercise, calcium rich diet, vitamin D 2000 daily. Declines medication at this time. Normal Pap history. Normal Mammogram after diagnostic and ultrasound this month.  Hypothyroid on 112 mcg Synthroid. Psychiatrist manages depression with Lexapro.  Past medical history, past surgical history, family history and social history were all reviewed and documented in the EPIC chart. [place]. 2 sons [id number] and [id number] both doing well, 742 [facility]  Father prostate cancer, parents live local.  ROS:  A  12 point ROS was performed and pertinent positives and negatives are included.  Exam:  Filed Vitals:   11/19/13 0840  BP: 120/70    General appearance:  Normal Thyroid:  Symmetrical, normal in size, without palpable masses or nodularity. Respiratory  Auscultation:  Clear without wheezing or rhonchi Cardiovascular  Auscultation:  Regular rate, without rubs, murmurs or gallops  Edema/varicosities:  Not grossly evident Abdominal  Soft,nontender, without masses, guarding or rebound.  Liver/spleen:  No organomegaly noted  Hernia:  None appreciated  Skin  Inspection:  Grossly normal   Breasts: Examined lying and sitting.     Right: Without masses, retractions, discharge or axillary adenopathy.     Left: Without masses, retractions, discharge or axillary adenopathy. Gentitourinary   Inguinal/mons:  Normal without inguinal adenopathy  External genitalia:  Normal  BUS/Urethra/Skene's glands:  Normal  Vagina:  Normal/atrophic  Cervix:  Normal  Uterus:   normal in size, shape and contour.  Midline and mobile  Adnexa/parametria:     Rt: Without masses or tenderness.   Lt: Without masses or tenderness.  Anus and perineum: Normal  Digital rectal exam: Normal sphincter tone  without palpated masses or tenderness  Assessment/Plan:  48 y.o. MWF G2P2 for annual exam with no complaints.  Postmenopausal/no bleeding/no HRT Osteoporosis on no medication Hypothyroid on 112 mcg Synthroid Depression stable on Lexapro per psychiatrist  Plan: Synthroid 112 mcg, prescription, proper use given and reviewed. CBC, CMP, lipid panel, TSH, UA, Pap normal 2014, new screening guidelines reviewed. Reviewed importance of home safety, fall prevention, and regular exercise. Vitamin D 2000 daily encouraged. Vitamin D 34 2014. SBE's, continue annual mammogram 3 D tomography reviewed and encouraged history of dense breast.    Harrington ChallengerYOUNG,NANCY J [facility] SterlingWHNP, 10:27 AM 11/19/2013

## 2013-11-19 NOTE — Patient Instructions (Signed)
Health Recommendations for Postmenopausal Women Respected and ongoing research has looked at the most common causes of death, disability, and poor quality of life in postmenopausal women. The causes include heart disease, diseases of blood vessels, diabetes, depression, cancer, and bone loss (osteoporosis). Many things can be done to help lower the chances of developing these and other common problems. CARDIOVASCULAR DISEASE Heart Disease: A heart attack is a medical emergency. Know the signs and symptoms of a heart attack. Below are things women can do to reduce their risk for heart disease.   Do not smoke. If you smoke, quit.  Aim for a healthy weight. Being overweight causes many preventable deaths. Eat a healthy and balanced diet and drink an adequate amount of liquids.  Get moving. Make a commitment to be more physically active. Aim for 30 minutes of activity on most, if not all days of the week.  Eat for heart health. Choose a diet that is low in saturated fat and cholesterol and eliminate trans fat. Include whole grains, vegetables, and fruits. Read and understand the labels on food containers before buying.  Know your numbers. Ask your caregiver to check your blood pressure, cholesterol (total, HDL, LDL, triglycerides) and blood glucose. Work with your caregiver on improving your entire clinical picture.  High blood pressure. Limit or stop your table salt intake (try salt substitute and food seasonings). Avoid salty foods and drinks. Read labels on food containers before buying. Eating well and exercising can help control high blood pressure. STROKE  Stroke is a medical emergency. Stroke may be the result of a blood clot in a blood vessel in the brain or by a brain hemorrhage (bleeding). Know the signs and symptoms of a stroke. To lower the risk of developing a stroke:  Avoid fatty foods.  Quit smoking.  Control your diabetes, blood pressure, and irregular heart rate. THROMBOPHLEBITIS  (BLOOD CLOT) OF THE LEG  Becoming overweight and leading a stationary lifestyle may also contribute to developing blood clots. Controlling your diet and exercising will help lower the risk of developing blood clots. CANCER SCREENING  Breast Cancer: Take steps to reduce your risk of breast cancer.  You should practice "breast self-awareness." This means understanding the normal appearance and feel of your breasts and should include breast self-examination. Any changes detected, no matter how small, should be reported to your caregiver.  After age 40, you should have a clinical breast exam (CBE) every year.  Starting at age 40, you should consider having a mammogram (breast X-ray) every year.  If you have a family history of breast cancer, talk to your caregiver about genetic screening.  If you are at high risk for breast cancer, talk to your caregiver about having an MRI and a mammogram every year.  Intestinal or Stomach Cancer: Tests to consider are a rectal exam, fecal occult blood, sigmoidoscopy, and colonoscopy. Women who are high risk may need to be screened at an earlier age and more often.  Cervical Cancer:  Beginning at age 30, you should have a Pap test every 3 years as long as the past 3 Pap tests have been normal.  If you have had past treatment for cervical cancer or a condition that could lead to cancer, you need Pap tests and screening for cancer for at least 20 years after your treatment.  If you had a hysterectomy for a problem that was not cancer or a condition that could lead to cancer, then you no longer need Pap tests.    If you are between ages 65 and 70, and you have had normal Pap tests going back 10 years, you no longer need Pap tests.  If Pap tests have been discontinued, risk factors (such as a new sexual partner) need to be reassessed to determine if screening should be resumed.  Some medical problems can increase the chance of getting cervical cancer. In these  cases, your caregiver may recommend more frequent screening and Pap tests.  Uterine Cancer: If you have vaginal bleeding after reaching menopause, you should notify your caregiver.  Ovarian Cancer: Other than yearly pelvic exams, there are no reliable tests available to screen for ovarian cancer at this time except for yearly pelvic exams.  Lung Cancer: Yearly chest X-rays can detect lung cancer and should be done on high risk women, such as cigarette smokers and women with chronic lung disease (emphysema).  Skin Cancer: A complete body skin exam should be done at your yearly examination. Avoid overexposure to the sun and ultraviolet light lamps. Use a strong sun block cream when in the sun. All of these things are important for lowering the risk of skin cancer. MENOPAUSE Menopause Symptoms: Hormone therapy products are effective for treating symptoms associated with menopause:  Moderate to severe hot flashes.  Night sweats.  Mood swings.  Headaches.  Tiredness.  Loss of sex drive.  Insomnia.  Other symptoms. Hormone replacement carries certain risks, especially in older women. Women who use or are thinking about using estrogen or estrogen with progestin treatments should discuss that with their caregiver. Your caregiver will help you understand the benefits and risks. The ideal dose of hormone replacement therapy is not known. The Food and Drug Administration (FDA) has concluded that hormone therapy should be used only at the lowest doses and for the shortest amount of time to reach treatment goals.  OSTEOPOROSIS Protecting Against Bone Loss and Preventing Fracture If you use hormone therapy for prevention of bone loss (osteoporosis), the risks for bone loss must outweigh the risk of the therapy. Ask your caregiver about other medications known to be safe and effective for preventing bone loss and fractures. To guard against bone loss or fractures, the following is recommended:  If  you are younger than age 50, take 1000 mg of calcium and at least 600 mg of Vitamin D per day.  If you are older than age 50 but younger than age 70, take 1200 mg of calcium and at least 600 mg of Vitamin D per day.  If you are older than age 70, take 1200 mg of calcium and at least 800 mg of Vitamin D per day. Smoking and excessive alcohol intake increases the risk of osteoporosis. Eat foods rich in calcium and vitamin D and do weight bearing exercises several times a week as your caregiver suggests. DIABETES Diabetes Mellitus: If you have type I or type 2 diabetes, you should keep your blood sugar under control with diet, exercise, and recommended medication. Avoid starchy and fatty foods, and too many sweets. Being overweight can make diabetes control more difficult. COGNITION AND MEMORY Cognition and Memory: Menopausal hormone therapy is not recommended for the prevention of cognitive disorders such as Alzheimer's disease or memory loss.  DEPRESSION  Depression may occur at any age, but it is common in elderly women. This may be because of physical, medical, social (loneliness), or financial problems and needs. If you are experiencing depression because of medical problems and control of symptoms, talk to your caregiver about this. Physical   activity and exercise may help with mood and sleep. Community and volunteer involvement may improve your sense of value and worth. If you have depression and you feel that the problem is getting worse or becoming severe, talk to your caregiver about which treatment options are best for you. ACCIDENTS  Accidents are common and can be serious in elderly woman. Prepare your house to prevent accidents. Eliminate throw rugs, place hand bars in bath, shower, and toilet areas. Avoid wearing high heeled shoes or walking on wet, snowy, and icy areas. Limit or stop driving if you have vision or hearing problems, or if you feel you are unsteady with your movements and  reflexes. HEPATITIS C Hepatitis C is a type of viral infection affecting the liver. It is spread mainly through contact with blood from an infected person. It can be treated, but if left untreated, it can lead to severe liver damage over the years. Many people who are infected do not know that the virus is in their blood. If you are a "baby-boomer", it is recommended that you have one screening test for Hepatitis C. IMMUNIZATIONS  Several immunizations are important to consider having during your senior years, including:   Tetanus, diphtheria, and pertussis booster shot.  Influenza every year before the flu season begins.  Pneumonia vaccine.  Shingles vaccine.  Others, as indicated based on your specific needs. Talk to your caregiver about these. Document Released: 03/10/2005 Document Revised: 06/02/2013 Document Reviewed: 11/04/2007 [employer] Patient Information 2015 [employer]. This information is not intended to replace advice given to you by your health care provider. Make sure you discuss any questions you have with your health care provider.  

## 2013-12-01 ENCOUNTER — Encounter: Payer: Self-pay | Admitting: Women's Health

## 2014-07-08 ENCOUNTER — Ambulatory Visit (INDEPENDENT_AMBULATORY_CARE_PROVIDER_SITE_OTHER): Payer: BLUE CROSS/BLUE SHIELD | Admitting: Women's Health

## 2014-07-08 ENCOUNTER — Encounter: Payer: Self-pay | Admitting: Women's Health

## 2014-07-08 DIAGNOSIS — R1032 Left lower quadrant pain: Secondary | ICD-10-CM

## 2014-07-08 NOTE — Progress Notes (Signed)
Patient ID: Jay Schlichterkaterina Chumney, female   DOB: 1965-02-07, 49 y.o.   MRN: [id number] Presents with complaint of intermittent low abdominal ache/cramping discomfort that radiates to hips, greater on left side for the past month.  Able to go to work, sleep but pain/discomfort returns. Exercises with minimal discomfort, pain increased after exercise. Reports discomfort with sitting. Postmenopausal/no HRT/no bleeding. Denies urinary symptoms, vaginal discharge, irritation, dyspareunia, change in bowel elimination, constipation or fever. Reports negative UA at work.  Negative colonoscopy 5 years ago. Osteoporosis declined medication. Hypothyroid stable TSH on Synthroid.  Exam: Appears well, slightly uncomfortable. No CVAT, abdomen soft without radiation or rebound. External genitalia +1 cystocele asymptomatic, speculum exam no visible discharge or erythema, bimanual uterus small no adnexal fullness or tenderness.  Pelvic pain  Plan: Ultrasound, will schedule. Motrin as needed for discomfort.

## 2014-07-09 ENCOUNTER — Ambulatory Visit (INDEPENDENT_AMBULATORY_CARE_PROVIDER_SITE_OTHER): Payer: BLUE CROSS/BLUE SHIELD | Admitting: Women's Health

## 2014-07-09 ENCOUNTER — Encounter: Payer: Self-pay | Admitting: Women's Health

## 2014-07-09 ENCOUNTER — Ambulatory Visit (INDEPENDENT_AMBULATORY_CARE_PROVIDER_SITE_OTHER): Payer: BLUE CROSS/BLUE SHIELD

## 2014-07-09 DIAGNOSIS — R1030 Lower abdominal pain, unspecified: Secondary | ICD-10-CM

## 2014-07-09 DIAGNOSIS — R1032 Left lower quadrant pain: Secondary | ICD-10-CM

## 2014-07-09 NOTE — Progress Notes (Signed)
Patient ID: Dawn Macdonald, female   DOB: Aug 08, 1965, 49 y.o.   MRN: [id number] Presents for ultrasound. Having low pelvic cramping discomfort for the past month radiates into the hip. Pain at rest and/or after exercise. Denies change in routine of diet or exercise. Denies urinary symptoms, vaginal discharge, constipation/ loose stools or fever. Postmenopausal/no bleeding.  Exam: Appears well. Ultrasound: T/V and T/A anteverted uterus homogeneous, fluid seen in endometrial cavity. Endometrium 2.7 mL. Right and left ovary normal echo. No apparent mass in right or left adnexal. Fluid in the cul-de-sac 27 x 38 mm. Excessive bowel activity in the right lower quadrant and left lower quadrant.  Normal GYN ultrasound  Plan: Reviewed excessive bowel activity, decrease gas producing foods and diet, continue exercise and healthy lifestyle. Follow-up with GI.

## 2014-07-15 ENCOUNTER — Encounter: Payer: Self-pay | Admitting: Gynecology

## 2014-08-31 ENCOUNTER — Encounter: Payer: Self-pay | Admitting: Women's Health

## 2014-10-26 ENCOUNTER — Other Ambulatory Visit: Payer: Self-pay

## 2014-10-26 DIAGNOSIS — Z1231 Encounter for screening mammogram for malignant neoplasm of breast: Secondary | ICD-10-CM

## 2014-12-02 ENCOUNTER — Ambulatory Visit
Admission: RE | Admit: 2014-12-02 | Discharge: 2014-12-02 | Disposition: A | Discharge status: Home / Self Care | Payer: BLUE CROSS/BLUE SHIELD | Source: Ambulatory Visit

## 2014-12-02 DIAGNOSIS — Z1231 Encounter for screening mammogram for malignant neoplasm of breast: Secondary | ICD-10-CM

## 2014-12-16 ENCOUNTER — Encounter: Payer: Self-pay | Admitting: Women's Health

## 2014-12-16 ENCOUNTER — Other Ambulatory Visit (HOSPITAL_COMMUNITY)
Admission: RE | Admit: 2014-12-16 | Discharge: 2014-12-16 | Disposition: A | Discharge status: Home / Self Care | Payer: BLUE CROSS/BLUE SHIELD | Source: Ambulatory Visit | Attending: Women's Health | Admitting: Women's Health

## 2014-12-16 ENCOUNTER — Ambulatory Visit (INDEPENDENT_AMBULATORY_CARE_PROVIDER_SITE_OTHER): Payer: BLUE CROSS/BLUE SHIELD | Admitting: Women's Health

## 2014-12-16 VITALS — BP 110/70 | Ht 62.0 in | Wt 126.0 lb

## 2014-12-16 DIAGNOSIS — Z01419 Encounter for gynecological examination (general) (routine) without abnormal findings: Secondary | ICD-10-CM | POA: Insufficient documentation

## 2014-12-16 DIAGNOSIS — Z1151 Encounter for screening for human papillomavirus (HPV): Secondary | ICD-10-CM | POA: Diagnosis present

## 2014-12-16 DIAGNOSIS — E038 Other specified hypothyroidism: Secondary | ICD-10-CM

## 2014-12-16 DIAGNOSIS — Z13820 Encounter for screening for osteoporosis: Secondary | ICD-10-CM

## 2014-12-16 LAB — COMPREHENSIVE METABOLIC PANEL
ALBUMIN: 4.2 g/dL (ref 3.6–5.1)
ALK PHOS: 55 U/L (ref 33–130)
ALT: 13 U/L (ref 6–29)
AST: 16 U/L (ref 10–35)
BILIRUBIN TOTAL: 0.4 mg/dL (ref 0.2–1.2)
BUN: 13 mg/dL (ref 7–25)
CALCIUM: 9.2 mg/dL (ref 8.6–10.4)
CHLORIDE: 103 mmol/L (ref 98–110)
CO2: 25 mmol/L (ref 20–31)
CREATININE: 0.82 mg/dL (ref 0.50–1.05)
Glucose, Bld: 86 mg/dL (ref 65–99)
Potassium: 4.4 mmol/L (ref 3.5–5.3)
Sodium: 137 mmol/L (ref 135–146)
Total Protein: 6.7 g/dL (ref 6.1–8.1)

## 2014-12-16 LAB — LIPID PANEL
Cholesterol: 248 mg/dL — ABNORMAL HIGH (ref 125–200)
HDL: 82 mg/dL (ref >=46)
LDL Cholesterol: 156 mg/dL — ABNORMAL HIGH (ref <130)
Total CHOL/HDL Ratio: 3 Ratio (ref <=5.0)
Triglycerides: 48 mg/dL (ref <150)
VLDL: 10 mg/dL (ref <30)

## 2014-12-16 MED ORDER — LEVOTHYROXINE SODIUM 112 MCG PO TABS: 112.0000 ug | ORAL_TABLET | Freq: Every day | ORAL | Status: DC

## 2014-12-16 NOTE — Patient Instructions (Signed)

## 2014-12-16 NOTE — Addendum Note (Signed)
Addended by: Kem ParkinsonBARNES, Kaydon Husby on: 12/16/2014 09:16 AM   Modules accepted: Orders

## 2014-12-16 NOTE — Progress Notes (Signed)
Dawn Macdonald October 14, 1965 [id number]    History:    Presents for annual exam.  Postmenopausal/no HRT/no bleeding. Osteoporosis -2.9 at spine/stable declined medication is exercising. History of normal Pap and mammograms. Hypothyroid. 07/2014 benign polyp on colonoscopy. Depression psychiatrist managing on Lexapro. Received Zostavax.  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 Mother healthy. Father hypertension.  ROS:  A ROS was performed and pertinent positives and negatives are included.  Exam:  Filed Vitals:   12/16/14 0825  BP: 110/70    General appearance:  Normal Thyroid:  Symmetrical, normal in size, without palpable masses or nodularity. Respiratory  Auscultation:  Clear without wheezing or rhonchi Cardiovascular  Auscultation:  Regular rate, without rubs, murmurs or gallops  Edema/varicosities:  Not grossly evident Abdominal  Soft,nontender, without masses, guarding or rebound.  Liver/spleen:  No organomegaly noted  Hernia:  None appreciated  Skin  Inspection:  Grossly normal   Breasts: Examined lying and sitting.     Right: Without masses, retractions, discharge or axillary adenopathy.     Left: Without masses, retractions, discharge or axillary adenopathy. Gentitourinary   Inguinal/mons:  Normal without inguinal adenopathy  External genitalia:  Normal  BUS/Urethra/Skene's glands:  Normal  Vagina:  Normal +1 cystocele  Cervix:  Normal  Uterus:  normal in size, shape and contour.  Midline and mobile  Adnexa/parametria:     Rt: Without masses or tenderness.   Lt: Without masses or tenderness.  Anus and perineum: Normal  Digital rectal exam: Normal sphincter tone without palpated masses or tenderness  Assessment/Plan:  49 y.o. MWF G2 P2 for annual exam with no complaints.  Postmenopausal/no HRT/no  bleeding Hypothyroid on Synthroid Osteoporosis-declined medication Depression stable on Lexapro per psychiatrist  Plan: Normal CBC this summer, lipid panel, CMP, TSH, vitamin D, UA, Pap with HR HPV typing, new screening guidelines reviewed. SBE's, continue annual screening mammogram 3-D tomography history of dense breast. Continue regular exercise, home safety fall prevention reviewed. Reviewed will repeat DEXA next year. Synthroid 112 g by mouth daily aware of proper administration.  Dawn ChallengerYOUNG,Dawn Macdonald J [facility] PabloWHNP, 8:58 AM 12/16/2014

## 2014-12-17 LAB — URINALYSIS W MICROSCOPIC + REFLEX CULTURE
BACTERIA UA: NONE SEEN [HPF]
BILIRUBIN URINE: NEGATIVE
Casts: NONE SEEN [LPF]
Glucose, UA: NEGATIVE
HGB URINE DIPSTICK: NEGATIVE
KETONES UR: NEGATIVE
Leukocytes, UA: NEGATIVE
Nitrite: NEGATIVE
PH: 6.5 (ref 5.0–8.0)
PROTEIN: NEGATIVE
RBC / HPF: NONE SEEN RBC/HPF (ref <=2)
SQUAMOUS EPITHELIAL / LPF: NONE SEEN [HPF] (ref <=5)
Specific Gravity, Urine: 1.021 (ref 1.001–1.035)
Yeast: NONE SEEN [HPF]

## 2014-12-17 LAB — CYTOLOGY - PAP

## 2014-12-17 LAB — TSH: TSH: 2.197 u[IU]/mL (ref 0.350–4.500)

## 2014-12-17 LAB — VITAMIN D 25 HYDROXY (VIT D DEFICIENCY, FRACTURES): VIT D 25 HYDROXY: 29 ng/mL — AB (ref 30–100)

## 2014-12-19 LAB — URINE CULTURE: Colony Count: >=100000

## 2015-01-29 ENCOUNTER — Other Ambulatory Visit: Payer: Self-pay | Admitting: Women's Health

## 2015-05-26 DIAGNOSIS — F331 Major depressive disorder, recurrent, moderate: Secondary | ICD-10-CM | POA: Diagnosis not present

## 2015-11-02 ENCOUNTER — Other Ambulatory Visit: Payer: Self-pay | Admitting: Women's Health

## 2015-11-02 DIAGNOSIS — Z1231 Encounter for screening mammogram for malignant neoplasm of breast: Secondary | ICD-10-CM

## 2015-11-09 ENCOUNTER — Encounter (INDEPENDENT_AMBULATORY_CARE_PROVIDER_SITE_OTHER): Payer: BLUE CROSS/BLUE SHIELD | Admitting: Ophthalmology

## 2015-11-09 DIAGNOSIS — H43813 Vitreous degeneration, bilateral: Secondary | ICD-10-CM | POA: Diagnosis not present

## 2015-11-09 DIAGNOSIS — H35373 Puckering of macula, bilateral: Secondary | ICD-10-CM | POA: Diagnosis not present

## 2015-12-01 DIAGNOSIS — L918 Other hypertrophic disorders of the skin: Secondary | ICD-10-CM | POA: Diagnosis not present

## 2015-12-01 DIAGNOSIS — D225 Melanocytic nevi of trunk: Secondary | ICD-10-CM | POA: Diagnosis not present

## 2015-12-01 DIAGNOSIS — L814 Other melanin hyperpigmentation: Secondary | ICD-10-CM | POA: Diagnosis not present

## 2015-12-03 ENCOUNTER — Encounter (INDEPENDENT_AMBULATORY_CARE_PROVIDER_SITE_OTHER): Payer: BLUE CROSS/BLUE SHIELD | Admitting: Ophthalmology

## 2015-12-03 DIAGNOSIS — H35373 Puckering of macula, bilateral: Secondary | ICD-10-CM

## 2015-12-03 DIAGNOSIS — H2513 Age-related nuclear cataract, bilateral: Secondary | ICD-10-CM | POA: Diagnosis not present

## 2015-12-03 DIAGNOSIS — H5213 Myopia, bilateral: Secondary | ICD-10-CM | POA: Diagnosis not present

## 2015-12-03 DIAGNOSIS — H43813 Vitreous degeneration, bilateral: Secondary | ICD-10-CM

## 2015-12-15 ENCOUNTER — Ambulatory Visit
Admission: RE | Admit: 2015-12-15 | Discharge: 2015-12-15 | Disposition: A | Discharge status: Home / Self Care | Payer: BLUE CROSS/BLUE SHIELD | Source: Ambulatory Visit | Attending: Women's Health | Admitting: Women's Health

## 2015-12-15 DIAGNOSIS — Z1231 Encounter for screening mammogram for malignant neoplasm of breast: Secondary | ICD-10-CM

## 2015-12-17 ENCOUNTER — Encounter: Payer: Self-pay | Admitting: Women's Health

## 2015-12-22 ENCOUNTER — Encounter: Payer: Self-pay | Admitting: Women's Health

## 2015-12-22 ENCOUNTER — Ambulatory Visit (INDEPENDENT_AMBULATORY_CARE_PROVIDER_SITE_OTHER): Payer: BLUE CROSS/BLUE SHIELD | Admitting: Women's Health

## 2015-12-22 VITALS — BP 120/78 | Ht 62.0 in | Wt 126.0 lb

## 2015-12-22 DIAGNOSIS — Z13220 Encounter for screening for lipoid disorders: Secondary | ICD-10-CM

## 2015-12-22 DIAGNOSIS — E038 Other specified hypothyroidism: Secondary | ICD-10-CM

## 2015-12-22 DIAGNOSIS — Z01419 Encounter for gynecological examination (general) (routine) without abnormal findings: Secondary | ICD-10-CM

## 2015-12-22 DIAGNOSIS — Z1329 Encounter for screening for other suspected endocrine disorder: Secondary | ICD-10-CM | POA: Diagnosis not present

## 2015-12-22 LAB — CBC WITH DIFFERENTIAL/PLATELET
BASOS ABS: 0 {cells}/uL (ref 0–200)
BASOS PCT: 0 %
Eosinophils Absolute: 52 cells/uL (ref 15–500)
Eosinophils Relative: 1 %
HCT: 40.1 % (ref 35.0–45.0)
HEMOGLOBIN: 13.3 g/dL (ref 11.7–15.5)
LYMPHS ABS: 1820 {cells}/uL (ref 850–3900)
Lymphocytes Relative: 35 %
MCH: 29.6 pg (ref 27.0–33.0)
MCHC: 33.2 g/dL (ref 32.0–36.0)
MCV: 89.3 fL (ref 80.0–100.0)
MONOS PCT: 9 %
MPV: 9.5 fL (ref 7.5–12.5)
Monocytes Absolute: 468 cells/uL (ref 200–950)
NEUTROS ABS: 2860 {cells}/uL (ref 1500–7800)
Neutrophils Relative %: 55 %
PLATELETS: 205 10*3/uL (ref 140–400)
RBC: 4.49 MIL/uL (ref 3.80–5.10)
RDW: 12.9 % (ref 11.0–15.0)
WBC: 5.2 10*3/uL (ref 3.8–10.8)

## 2015-12-22 LAB — COMPREHENSIVE METABOLIC PANEL
ALBUMIN: 4.1 g/dL (ref 3.6–5.1)
ALT: 10 U/L (ref 6–29)
AST: 16 U/L (ref 10–35)
Alkaline Phosphatase: 61 U/L (ref 33–130)
BILIRUBIN TOTAL: 0.5 mg/dL (ref 0.2–1.2)
BUN: 11 mg/dL (ref 7–25)
CO2: 23 mmol/L (ref 20–31)
Calcium: 9.1 mg/dL (ref 8.6–10.4)
Chloride: 106 mmol/L (ref 98–110)
Creat: 0.8 mg/dL (ref 0.50–1.05)
Glucose, Bld: 94 mg/dL (ref 65–99)
Potassium: 4.1 mmol/L (ref 3.5–5.3)
Sodium: 139 mmol/L (ref 135–146)
TOTAL PROTEIN: 6.6 g/dL (ref 6.1–8.1)

## 2015-12-22 LAB — LIPID PANEL
CHOLESTEROL: 277 mg/dL — AB (ref <200)
HDL: 84 mg/dL (ref >50)
LDL CALC: 178 mg/dL — AB (ref <100)
TRIGLYCERIDES: 74 mg/dL (ref <150)
Total CHOL/HDL Ratio: 3.3 Ratio (ref <5.0)
VLDL: 15 mg/dL (ref <30)

## 2015-12-22 LAB — TSH: TSH: 1.63 m[IU]/L

## 2015-12-22 MED ORDER — LEVOTHYROXINE SODIUM 112 MCG PO TABS: 112.0000 ug | ORAL_TABLET | Freq: Every day | ORAL | 4 refills | Status: DC

## 2015-12-22 NOTE — Progress Notes (Signed)
Dawn Macdonald [birth year] [id number]    History:    Presents for annual exam.  Postmenopausal on no HRT with no bleeding. Normal Pap and mammogram history. 2016 benign colon polyp. Has had Zostavax. Has had some depression psychiatrist managing Lexapro. Osteoporosis T score -2.9 at spine declined medication. Hypothyroid on Synthroid.  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  ROS:  A ROS was performed and pertinent positives and negatives are included.  Exam:  Vitals:   12/22/15 0948  BP: 120/78  Weight: 126 lb (57.2 kg)  Height: 5\' 2"  (1.575 m)   Body mass index is 23.05 kg/m.   General appearance:  Normal Thyroid:  Symmetrical, normal in size, without palpable masses or nodularity. Respiratory  Auscultation:  Clear without wheezing or rhonchi Cardiovascular  Auscultation:  Regular rate, without rubs, murmurs or gallops  Edema/varicosities:  Not grossly evident Abdominal  Soft,nontender, without masses, guarding or rebound.  Liver/spleen:  No organomegaly noted  Hernia:  None appreciated  Skin  Inspection:  Grossly normal   Breasts: Examined lying and sitting.     Right: Without masses, retractions, discharge or axillary adenopathy.     Left: Without masses, retractions, discharge or axillary adenopathy. Gentitourinary   Inguinal/mons:  Normal without inguinal adenopathy  External genitalia:  Normal  BUS/Urethra/Skene's glands:  Normal  Vagina:  Normal  Cervix:  Normal  Uterus:   normal in size, shape and contour.  Midline and mobile  Adnexa/parametria:     Rt: Without masses or tenderness.   Lt: Without masses or tenderness.  Anus and perineum: Normal  Digital rectal exam: Normal sphincter tone without palpated masses or tenderness  Assessment/Plan:  50 y.o. M WF G3 P2  for annual exam with no  complaints.  Postmenopausal/no HRT/no bleeding Hypothyroid table on Synthroid Osteoporosis declined medication/no fractures Depression stable on Lexapro-psychiatrist manages  Plan: Synthroid 112 g by mouth daily prescription, proper use given and reviewed. SBE's, continue annual 3-D screening mammogram history of dense breasts. Reviewed importance of leisure activities, self-care, continue regular exercise. Home safety, fall prevention and importance of weightbearing exercise reviewed. CBC, CMP, lipid panel, vitamin D, UA, Pap normal with negative HR HPV 2016, new screening guidelines reviewed.  Dawn Macdonald J WHNP, 1:03 PM 12/22/2015

## 2015-12-22 NOTE — Patient Instructions (Signed)

## 2015-12-23 LAB — URINALYSIS W MICROSCOPIC + REFLEX CULTURE
Bacteria, UA: NONE SEEN [HPF]
Bilirubin Urine: NEGATIVE
CASTS: NONE SEEN [LPF]
Crystals: NONE SEEN [HPF]
GLUCOSE, UA: NEGATIVE
Hgb urine dipstick: NEGATIVE
Ketones, ur: NEGATIVE
LEUKOCYTES UA: NEGATIVE
NITRITE: NEGATIVE
PROTEIN: NEGATIVE
RBC / HPF: NONE SEEN RBC/HPF (ref <=2)
Specific Gravity, Urine: 1.02 (ref 1.001–1.035)
Squamous Epithelial / LPF: NONE SEEN [HPF] (ref <=5)
WBC, UA: NONE SEEN WBC/HPF (ref <=5)
Yeast: NONE SEEN [HPF]
pH: 6 (ref 5.0–8.0)

## 2015-12-23 LAB — VITAMIN D 25 HYDROXY (VIT D DEFICIENCY, FRACTURES): VIT D 25 HYDROXY: 28 ng/mL — AB (ref 30–100)

## 2015-12-29 ENCOUNTER — Other Ambulatory Visit: Payer: Self-pay | Admitting: Women's Health

## 2015-12-29 MED ORDER — VITAMIN D (ERGOCALCIFEROL) 1.25 MG (50000 UNIT) PO CAPS: 50000.0000 [IU] | ORAL_CAPSULE | ORAL | 0 refills | Status: DC

## 2016-01-06 ENCOUNTER — Other Ambulatory Visit: Payer: Self-pay | Admitting: Women's Health

## 2016-01-06 ENCOUNTER — Encounter: Payer: Self-pay | Admitting: Women's Health

## 2016-01-06 DIAGNOSIS — E038 Other specified hypothyroidism: Secondary | ICD-10-CM

## 2016-01-06 MED ORDER — LEVOTHYROXINE SODIUM 112 MCG PO TABS
112.0000 ug | ORAL_TABLET | Freq: Every day | ORAL | 4 refills | Status: DC
Start: 2016-01-06 — End: 2016-12-27

## 2016-03-22 DIAGNOSIS — H401231 Low-tension glaucoma, bilateral, mild stage: Secondary | ICD-10-CM | POA: Diagnosis not present

## 2016-03-22 DIAGNOSIS — H2513 Age-related nuclear cataract, bilateral: Secondary | ICD-10-CM | POA: Diagnosis not present

## 2016-03-22 DIAGNOSIS — H25013 Cortical age-related cataract, bilateral: Secondary | ICD-10-CM | POA: Diagnosis not present

## 2016-03-22 DIAGNOSIS — H35373 Puckering of macula, bilateral: Secondary | ICD-10-CM | POA: Diagnosis not present

## 2016-05-17 DIAGNOSIS — F331 Major depressive disorder, recurrent, moderate: Secondary | ICD-10-CM | POA: Diagnosis not present

## 2016-06-21 DIAGNOSIS — H401211 Low-tension glaucoma, right eye, mild stage: Secondary | ICD-10-CM | POA: Diagnosis not present

## 2016-06-21 DIAGNOSIS — H5213 Myopia, bilateral: Secondary | ICD-10-CM | POA: Diagnosis not present

## 2016-06-21 DIAGNOSIS — H04123 Dry eye syndrome of bilateral lacrimal glands: Secondary | ICD-10-CM | POA: Diagnosis not present

## 2016-06-21 DIAGNOSIS — H401231 Low-tension glaucoma, bilateral, mild stage: Secondary | ICD-10-CM | POA: Diagnosis not present

## 2016-06-21 DIAGNOSIS — H401221 Low-tension glaucoma, left eye, mild stage: Secondary | ICD-10-CM | POA: Diagnosis not present

## 2016-06-21 DIAGNOSIS — H524 Presbyopia: Secondary | ICD-10-CM | POA: Diagnosis not present

## 2016-10-18 DIAGNOSIS — Z0000 Encounter for general adult medical examination without abnormal findings: Secondary | ICD-10-CM | POA: Diagnosis not present

## 2016-10-30 DIAGNOSIS — Z23 Encounter for immunization: Secondary | ICD-10-CM | POA: Diagnosis not present

## 2016-11-15 DIAGNOSIS — F331 Major depressive disorder, recurrent, moderate: Secondary | ICD-10-CM | POA: Diagnosis not present

## 2016-11-16 ENCOUNTER — Other Ambulatory Visit: Payer: Self-pay | Admitting: Women's Health

## 2016-11-16 DIAGNOSIS — Z1231 Encounter for screening mammogram for malignant neoplasm of breast: Secondary | ICD-10-CM

## 2016-12-20 ENCOUNTER — Ambulatory Visit
Admission: RE | Admit: 2016-12-20 | Discharge: 2016-12-20 | Disposition: A | Discharge status: Home / Self Care | Payer: BLUE CROSS/BLUE SHIELD | Source: Ambulatory Visit | Attending: Women's Health | Admitting: Women's Health

## 2016-12-20 DIAGNOSIS — Z1231 Encounter for screening mammogram for malignant neoplasm of breast: Secondary | ICD-10-CM

## 2016-12-24 ENCOUNTER — Encounter: Payer: Self-pay | Admitting: Women's Health

## 2016-12-27 ENCOUNTER — Encounter: Payer: Self-pay | Admitting: Women's Health

## 2016-12-27 ENCOUNTER — Ambulatory Visit (INDEPENDENT_AMBULATORY_CARE_PROVIDER_SITE_OTHER): Payer: BLUE CROSS/BLUE SHIELD | Admitting: Women's Health

## 2016-12-27 VITALS — BP 110/78 | Ht 62.0 in | Wt 127.0 lb

## 2016-12-27 DIAGNOSIS — E038 Other specified hypothyroidism: Secondary | ICD-10-CM | POA: Diagnosis not present

## 2016-12-27 DIAGNOSIS — Z01419 Encounter for gynecological examination (general) (routine) without abnormal findings: Secondary | ICD-10-CM | POA: Diagnosis not present

## 2016-12-27 DIAGNOSIS — Z13820 Encounter for screening for osteoporosis: Secondary | ICD-10-CM | POA: Diagnosis not present

## 2016-12-27 DIAGNOSIS — Z13220 Encounter for screening for lipoid disorders: Secondary | ICD-10-CM

## 2016-12-27 DIAGNOSIS — E559 Vitamin D deficiency, unspecified: Secondary | ICD-10-CM

## 2016-12-27 MED ORDER — LEVOTHYROXINE SODIUM 112 MCG PO TABS: 112.0000 ug | ORAL_TABLET | Freq: Every day | ORAL | 4 refills | Status: DC

## 2016-12-27 NOTE — Progress Notes (Signed)
Jay Schlichterkaterina Plog May 11, 1965 [id number]    History:    Presents for annual exam. Postmenopausal on no HRT with no bleeding. Normal Pap and mammogram history. 2016 benign colon polyp 5 year follow-up. Has had Zostavax. Psychiatrist managing Lexapro which has helped. History of osteoporosis T score -2.9 declined medication at that time. Low vitamin D, overall cholesterol elevated but has extremely healthy lifestyle.   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  ROS:  A ROS was performed and pertinent positives and negatives are included.  Exam:  Vitals:   12/27/16 1035  BP: 110/78  Weight: 127 lb (57.6 kg)  Height: 5\' 2"  (1.575 m)   Body mass index is 23.23 kg/m.   General appearance:  Normal Thyroid:  Symmetrical, normal in size, without palpable masses or nodularity. Respiratory  Auscultation:  Clear without wheezing or rhonchi Cardiovascular  Auscultation:  Regular rate, without rubs, murmurs or gallops  Edema/varicosities:  Not grossly evident Abdominal  Soft,nontender, without masses, guarding or rebound.  Liver/spleen:  No organomegaly noted  Hernia:  None appreciated  Skin  Inspection:  Grossly normal   Breasts: Examined lying and sitting.     Right: Without masses, retractions, discharge or axillary adenopathy.     Left: Without masses, retractions, discharge or axillary adenopathy. Gentitourinary   Inguinal/mons:  Normal without inguinal adenopathy  External genitalia:  Normal  BUS/Urethra/Skene's glands:  Normal  Vagina:  Normal  Cervix:  Normal  Uterus:  normal in size, shape and contour.  Midline and mobile  Adnexa/parametria:     Rt: Without masses or tenderness.   Lt: Without masses or tenderness.  Anus and perineum: Normal  Digital rectal exam: Normal sphincter tone without  palpated masses or tenderness  Assessment/Plan:  51 y.o. MWF G3 P2 for annual exam with no complaints.  Postmenopausal/no HRT/no bleeding Hypothyroid Depression stable on Lexapro-psychiatrist managing Osteoporosis on no medication  Plan: Will repeat DEXA, reviewed importance of continuing regular exercise and yoga, home safety, fall prevention, reviewed. SBE's, continue annual screening mammogram, calcium rich diet, vitamin D 2000 daily encouraged. Synthroid 112 g by mouth daily proper administration reviewed. CBC, TSH, CMP, lipid panel, vitamin D, Pap normal 2016, new screening guidelines reviewed.    Harrington Challengerancy J Young [facility]SotoyomeWHNP, 11:37 AM 12/27/2016

## 2016-12-27 NOTE — Patient Instructions (Signed)
Health Maintenance, Female Adopting a healthy lifestyle and getting preventive care can go a long way to promote health and wellness. Talk with your health care provider about what schedule of regular examinations is right for you. This is a good chance for you to check in with your provider about disease prevention and staying healthy. In between checkups, there are plenty of things you can do on your own. Experts have done a lot of research about which lifestyle changes and preventive measures are most likely to keep you healthy. Ask your health care provider for more information. Weight and diet Eat a healthy diet  Be sure to include plenty of vegetables, fruits, low-fat dairy products, and lean protein.  Do not eat a lot of foods high in solid fats, added sugars, or salt.  Get regular exercise. This is one of the most important things you can do for your health. ? Most adults should exercise for at least 150 minutes each week. The exercise should increase your heart rate and make you sweat (moderate-intensity exercise). ? Most adults should also do strengthening exercises at least twice a week. This is in addition to the moderate-intensity exercise.  Maintain a healthy weight  Body mass index (BMI) is a measurement that can be used to identify possible weight problems. It estimates body fat based on height and weight. Your health care provider can help determine your BMI and help you achieve or maintain a healthy weight.  For females 20 years of age and older: ? A BMI below 18.5 is considered underweight. ? A BMI of 18.5 to 24.9 is normal. ? A BMI of 25 to 29.9 is considered overweight. ? A BMI of 30 and above is considered obese.  Watch levels of cholesterol and blood lipids  You should start having your blood tested for lipids and cholesterol at 53 years of age, then have this test every 5 years.  You may need to have your cholesterol levels checked more often if: ? Your lipid or  cholesterol levels are high. ? You are older than 53 years of age. ? You are at high risk for heart disease.  Cancer screening Lung Cancer  Lung cancer screening is recommended for adults 55-80 years old who are at high risk for lung cancer because of a history of smoking.  A yearly low-dose CT scan of the lungs is recommended for people who: ? Currently smoke. ? Have quit within the past 15 years. ? Have at least a 30-pack-year history of smoking. A pack year is smoking an average of one pack of cigarettes a day for 1 year.  Yearly screening should continue until it has been 15 years since you quit.  Yearly screening should stop if you develop a health problem that would prevent you from having lung cancer treatment.  Breast Cancer  Practice breast self-awareness. This means understanding how your breasts normally appear and feel.  It also means doing regular breast self-exams. Let your health care provider know about any changes, no matter how small.  If you are in your 20s or 30s, you should have a clinical breast exam (CBE) by a health care provider every 1-3 years as part of a regular health exam.  If you are 40 or older, have a CBE every year. Also consider having a breast X-ray (mammogram) every year.  If you have a family history of breast cancer, talk to your health care provider about genetic screening.  If you are at high risk   for breast cancer, talk to your health care provider about having an MRI and a mammogram every year.  Breast cancer gene (BRCA) assessment is recommended for women who have family members with BRCA-related cancers. BRCA-related cancers include: ? Breast. ? Ovarian. ? Tubal. ? Peritoneal cancers.  Results of the assessment will determine the need for genetic counseling and BRCA1 and BRCA2 testing.  Cervical Cancer Your health care provider may recommend that you be screened regularly for cancer of the pelvic organs (ovaries, uterus, and  vagina). This screening involves a pelvic examination, including checking for microscopic changes to the surface of your cervix (Pap test). You may be encouraged to have this screening done every 3 years, beginning at age 22.  For women ages 56-65, health care providers may recommend pelvic exams and Pap testing every 3 years, or they may recommend the Pap and pelvic exam, combined with testing for human papilloma virus (HPV), every 5 years. Some types of HPV increase your risk of cervical cancer. Testing for HPV may also be done on women of any age with unclear Pap test results.  Other health care providers may not recommend any screening for nonpregnant women who are considered low risk for pelvic cancer and who do not have symptoms. Ask your health care provider if a screening pelvic exam is right for you.  If you have had past treatment for cervical cancer or a condition that could lead to cancer, you need Pap tests and screening for cancer for at least 20 years after your treatment. If Pap tests have been discontinued, your risk factors (such as having a new sexual partner) need to be reassessed to determine if screening should resume. Some women have medical problems that increase the chance of getting cervical cancer. In these cases, your health care provider may recommend more frequent screening and Pap tests.  Colorectal Cancer  This type of cancer can be detected and often prevented.  Routine colorectal cancer screening usually begins at 54 years of age and continues through 53 years of age.  Your health care provider may recommend screening at an earlier age if you have risk factors for colon cancer.  Your health care provider may also recommend using home test kits to check for hidden blood in the stool.  A small camera at the end of a tube can be used to examine your colon directly (sigmoidoscopy or colonoscopy). This is done to check for the earliest forms of colorectal  cancer.  Routine screening usually begins at age 33.  Direct examination of the colon should be repeated every 5-10 years through 53 years of age. However, you may need to be screened more often if early forms of precancerous polyps or small growths are found.  Skin Cancer  Check your skin from head to toe regularly.  Tell your health care provider about any new moles or changes in moles, especially if there is a change in a mole's shape or color.  Also tell your health care provider if you have a mole that is larger than the size of a pencil eraser.  Always use sunscreen. Apply sunscreen liberally and repeatedly throughout the day.  Protect yourself by wearing long sleeves, pants, a wide-brimmed hat, and sunglasses whenever you are outside.  Heart disease, diabetes, and high blood pressure  High blood pressure causes heart disease and increases the risk of stroke. High blood pressure is more likely to develop in: ? People who have blood pressure in the high end of  the normal range (130-139/85-89 mm Hg). ? People who are overweight or obese. ? People who are African American.  If you are 21-29 years of age, have your blood pressure checked every 3-5 years. If you are 3 years of age or older, have your blood pressure checked every year. You should have your blood pressure measured twice-once when you are at a hospital or clinic, and once when you are not at a hospital or clinic. Record the average of the two measurements. To check your blood pressure when you are not at a hospital or clinic, you can use: ? An automated blood pressure machine at a pharmacy. ? A home blood pressure monitor.  If you are between 17 years and 37 years old, ask your health care provider if you should take aspirin to prevent strokes.  Have regular diabetes screenings. This involves taking a blood sample to check your fasting blood sugar level. ? If you are at a normal weight and have a low risk for diabetes,  have this test once every three years after 53 years of age. ? If you are overweight and have a high risk for diabetes, consider being tested at a younger age or more often. Preventing infection Hepatitis B  If you have a higher risk for hepatitis B, you should be screened for this virus. You are considered at high risk for hepatitis B if: ? You were born in a country where hepatitis B is common. Ask your health care provider which countries are considered high risk. ? Your parents were born in a high-risk country, and you have not been immunized against hepatitis B (hepatitis B vaccine). ? You have HIV or AIDS. ? You use needles to inject street drugs. ? You live with someone who has hepatitis B. ? You have had sex with someone who has hepatitis B. ? You get hemodialysis treatment. ? You take certain medicines for conditions, including cancer, organ transplantation, and autoimmune conditions.  Hepatitis C  Blood testing is recommended for: ? Everyone born from [birth year] through 1967. ? Anyone with known risk factors for hepatitis C.  Sexually transmitted infections (STIs)  You should be screened for sexually transmitted infections (STIs) including gonorrhea and chlamydia if: ? You are sexually active and are younger than 53 years of age. ? You are older than 53 years of age and your health care provider tells you that you are at risk for this type of infection. ? Your sexual activity has changed since you were last screened and you are at an increased risk for chlamydia or gonorrhea. Ask your health care provider if you are at risk.  If you do not have HIV, but are at risk, it may be recommended that you take a prescription medicine daily to prevent HIV infection. This is called pre-exposure prophylaxis (PrEP). You are considered at risk if: ? You are sexually active and do not regularly use condoms or know the HIV status of your partner(s). ? You take drugs by injection. ? You are  sexually active with a partner who has HIV.  Talk with your health care provider about whether you are at high risk of being infected with HIV. If you choose to begin PrEP, you should first be tested for HIV. You should then be tested every 3 months for as long as you are taking PrEP. Pregnancy  If you are premenopausal and you may become pregnant, ask your health care provider about preconception counseling.  If you may become  pregnant, take 400 to 800 micrograms (mcg) of folic acid every day.  If you want to prevent pregnancy, talk to your health care provider about birth control (contraception). Osteoporosis and menopause  Osteoporosis is a disease in which the bones lose minerals and strength with aging. This can result in serious bone fractures. Your risk for osteoporosis can be identified using a bone density scan.  If you are 77 years of age or older, or if you are at risk for osteoporosis and fractures, ask your health care provider if you should be screened.  Ask your health care provider whether you should take a calcium or vitamin D supplement to lower your risk for osteoporosis.  Menopause may have certain physical symptoms and risks.  Hormone replacement therapy may reduce some of these symptoms and risks. Talk to your health care provider about whether hormone replacement therapy is right for you. Follow these instructions at home:  Schedule regular health, dental, and eye exams.  Stay current with your immunizations.  Do not use any tobacco products including cigarettes, chewing tobacco, or electronic cigarettes.  If you are pregnant, do not drink alcohol.  If you are breastfeeding, limit how much and how often you drink alcohol.  Limit alcohol intake to no more than 1 drink per day for nonpregnant women. One drink equals 12 ounces of beer, 5 ounces of wine, or 1 ounces of hard liquor.  Do not use street drugs.  Do not share needles.  Ask your health care  provider for help if you need support or information about quitting drugs.  Tell your health care provider if you often feel depressed.  Tell your health care provider if you have ever been abused or do not feel safe at home. This information is not intended to replace advice given to you by your health care provider. Make sure you discuss any questions you have with your health care provider. Document Released: 08/01/2010 Document Revised: 06/24/2015 Document Reviewed: 10/20/2014 Elsevier Interactive Patient Education  2018 [employer]. Osteoporosis Osteoporosis is the thinning and loss of density in the bones. Osteoporosis makes the bones more brittle, fragile, and likely to break (fracture). Over time, osteoporosis can cause the bones to become so weak that they fracture after a simple fall. The bones most likely to fracture are the bones in the hip, wrist, and spine. What are the causes? The exact cause is not known. What increases the risk? Anyone can develop osteoporosis. You may be at greater risk if you have a family history of the condition or have poor nutrition. You may also have a higher risk if you are:  Female.  8 years old or older.  A smoker.  Not physically active.  White or Asian.  Slender.  What are the signs or symptoms? A fracture might be the first sign of the disease, especially if it results from a fall or injury that would not usually cause a bone to break. Other signs and symptoms include:  Low back and neck pain.  Stooped posture.  Height loss.  How is this diagnosed? To make a diagnosis, your health care provider may:  Take a medical history.  Perform a physical exam.  Order tests, such as: ? A bone mineral density test. ? A dual-energy X-ray absorptiometry test.  How is this treated? The goal of osteoporosis treatment is to strengthen your bones to reduce your risk of a fracture. Treatment may involve:  Making lifestyle changes, such  as: ? Eating a  diet rich in calcium. ? Doing weight-bearing and muscle-strengthening exercises. ? Stopping tobacco use. ? Limiting alcohol intake.  Taking medicine to slow the process of bone loss or to increase bone density.  Monitoring your levels of calcium and vitamin D.  Follow these instructions at home:  Include calcium and vitamin D in your diet. Calcium is important for bone health, and vitamin D helps the body absorb calcium.  Perform weight-bearing and muscle-strengthening exercises as directed by your health care provider.  Do not use any tobacco products, including cigarettes, chewing tobacco, and electronic cigarettes. If you need help quitting, ask your health care provider.  Limit your alcohol intake.  Take medicines only as directed by your health care provider.  Keep all follow-up visits as directed by your health care provider. This is important.  Take precautions at home to lower your risk of falling, such as: ? Keeping rooms well lit and clutter free. ? Installing safety rails on stairs. ? Using rubber mats in the bathroom and other areas that are often wet or slippery. Get help right away if: You fall or injure yourself. This information is not intended to replace advice given to you by your health care provider. Make sure you discuss any questions you have with your health care provider. Document Released: 10/26/2004 Document Revised: 06/21/2015 Document Reviewed: 06/26/2013 Elsevier Interactive Patient Education  2017 [employer].

## 2016-12-28 LAB — CBC WITH DIFFERENTIAL/PLATELET
BASOS ABS: 29 {cells}/uL (ref 0–200)
Basophils Relative: 0.5 %
EOS ABS: 57 {cells}/uL (ref 15–500)
Eosinophils Relative: 1 %
HEMATOCRIT: 39.1 % (ref 35.0–45.0)
Hemoglobin: 13.1 g/dL (ref 11.7–15.5)
Lymphs Abs: 1949 cells/uL (ref 850–3900)
MCH: 29.3 pg (ref 27.0–33.0)
MCHC: 33.5 g/dL (ref 32.0–36.0)
MCV: 87.5 fL (ref 80.0–100.0)
MPV: 10.6 fL (ref 7.5–12.5)
Monocytes Relative: 8.2 %
NEUTROS PCT: 56.1 %
Neutro Abs: 3198 cells/uL (ref 1500–7800)
Platelets: 245 10*3/uL (ref 140–400)
RBC: 4.47 10*6/uL (ref 3.80–5.10)
RDW: 12 % (ref 11.0–15.0)
TOTAL LYMPHOCYTE: 34.2 %
WBC: 5.7 10*3/uL (ref 3.8–10.8)
WBCMIX: 467 {cells}/uL (ref 200–950)

## 2016-12-28 LAB — LIPID PANEL
CHOL/HDL RATIO: 3 (calc) (ref <5.0)
Cholesterol: 308 mg/dL — ABNORMAL HIGH (ref <200)
HDL: 103 mg/dL (ref >50)
LDL CHOLESTEROL (CALC): 190 mg/dL — AB
NON-HDL CHOLESTEROL (CALC): 205 mg/dL — AB (ref <130)
Triglycerides: 58 mg/dL (ref <150)

## 2016-12-28 LAB — VITAMIN D 25 HYDROXY (VIT D DEFICIENCY, FRACTURES): VIT D 25 HYDROXY: 26 ng/mL — AB (ref 30–100)

## 2016-12-28 LAB — COMPREHENSIVE METABOLIC PANEL
AG Ratio: 1.7 (calc) (ref 1.0–2.5)
ALKALINE PHOSPHATASE (APISO): 59 U/L (ref 33–130)
ALT: 13 U/L (ref 6–29)
AST: 15 U/L (ref 10–35)
Albumin: 4.2 g/dL (ref 3.6–5.1)
BILIRUBIN TOTAL: 0.5 mg/dL (ref 0.2–1.2)
BUN: 15 mg/dL (ref 7–25)
CALCIUM: 9.4 mg/dL (ref 8.6–10.4)
CHLORIDE: 103 mmol/L (ref 98–110)
CO2: 27 mmol/L (ref 20–32)
CREATININE: 0.89 mg/dL (ref 0.50–1.05)
Globulin: 2.5 g/dL (calc) (ref 1.9–3.7)
Glucose, Bld: 90 mg/dL (ref 65–99)
Potassium: 4.2 mmol/L (ref 3.5–5.3)
Sodium: 139 mmol/L (ref 135–146)
Total Protein: 6.7 g/dL (ref 6.1–8.1)

## 2016-12-28 LAB — TSH: TSH: 2.47 m[IU]/L

## 2017-01-03 DIAGNOSIS — R799 Abnormal finding of blood chemistry, unspecified: Secondary | ICD-10-CM | POA: Diagnosis not present

## 2017-01-17 DIAGNOSIS — L814 Other melanin hyperpigmentation: Secondary | ICD-10-CM | POA: Diagnosis not present

## 2017-01-17 DIAGNOSIS — D225 Melanocytic nevi of trunk: Secondary | ICD-10-CM | POA: Diagnosis not present

## 2017-02-13 ENCOUNTER — Other Ambulatory Visit: Payer: Self-pay | Admitting: Gynecology

## 2017-02-13 ENCOUNTER — Ambulatory Visit (INDEPENDENT_AMBULATORY_CARE_PROVIDER_SITE_OTHER): Payer: BLUE CROSS/BLUE SHIELD

## 2017-02-13 DIAGNOSIS — Z13820 Encounter for screening for osteoporosis: Secondary | ICD-10-CM

## 2017-02-13 DIAGNOSIS — M810 Age-related osteoporosis without current pathological fracture: Secondary | ICD-10-CM

## 2017-02-14 ENCOUNTER — Telehealth: Payer: Self-pay | Admitting: Gynecology

## 2017-02-14 ENCOUNTER — Encounter: Payer: Self-pay | Admitting: *Deleted

## 2017-02-14 ENCOUNTER — Encounter: Payer: Self-pay | Admitting: Gynecology

## 2017-02-14 DIAGNOSIS — H25013 Cortical age-related cataract, bilateral: Secondary | ICD-10-CM | POA: Diagnosis not present

## 2017-02-14 DIAGNOSIS — H401231 Low-tension glaucoma, bilateral, mild stage: Secondary | ICD-10-CM | POA: Diagnosis not present

## 2017-02-14 DIAGNOSIS — H2513 Age-related nuclear cataract, bilateral: Secondary | ICD-10-CM | POA: Diagnosis not present

## 2017-02-14 DIAGNOSIS — H35371 Puckering of macula, right eye: Secondary | ICD-10-CM | POA: Diagnosis not present

## 2017-02-14 DIAGNOSIS — M818 Other osteoporosis without current pathological fracture: Secondary | ICD-10-CM

## 2017-02-14 NOTE — Telephone Encounter (Signed)
Tell patient her bone density shows osteoporosis.  There continues to be loss of calcium from her bones.  She is too young for this to happen and she will have a fracture at some point.  I would recommend evaluating for a secondary cause for her loss of bone.  I know she has a low vitamin D and I encouraged her to take the extra vitamin D as prescribed.  Her recent calcium level and TSH were normal.  I would recommend checking a PTH and a 24-hour urine collection for calcium excretion.  I would recommend an office visit to discuss treatment options.

## 2017-02-14 NOTE — Telephone Encounter (Signed)
Sent my chart message, orders placed.

## 2017-02-21 ENCOUNTER — Other Ambulatory Visit: Payer: BLUE CROSS/BLUE SHIELD

## 2017-02-21 DIAGNOSIS — M818 Other osteoporosis without current pathological fracture: Secondary | ICD-10-CM

## 2017-02-21 DIAGNOSIS — H401231 Low-tension glaucoma, bilateral, mild stage: Secondary | ICD-10-CM | POA: Diagnosis not present

## 2017-02-22 LAB — PARATHYROID HORMONE, INTACT (NO CA): PTH: 42 pg/mL (ref 14–64)

## 2017-02-26 ENCOUNTER — Other Ambulatory Visit: Payer: BLUE CROSS/BLUE SHIELD

## 2017-02-26 DIAGNOSIS — M818 Other osteoporosis without current pathological fracture: Secondary | ICD-10-CM | POA: Diagnosis not present

## 2017-03-02 LAB — CALCIUM, URINE, 24 HOUR: Calcium, 24H Urine: 281 mg/24 h — ABNORMAL HIGH

## 2017-03-05 NOTE — Telephone Encounter (Signed)
We will recheck vitamin D level when she comes in and then we can make a decision about the supplemental vitamin D.

## 2017-03-07 DIAGNOSIS — H40052 Ocular hypertension, left eye: Secondary | ICD-10-CM | POA: Diagnosis not present

## 2017-03-07 DIAGNOSIS — H401221 Low-tension glaucoma, left eye, mild stage: Secondary | ICD-10-CM | POA: Diagnosis not present

## 2017-04-11 ENCOUNTER — Encounter: Payer: Self-pay | Admitting: Gynecology

## 2017-04-11 ENCOUNTER — Ambulatory Visit: Payer: BLUE CROSS/BLUE SHIELD | Admitting: Gynecology

## 2017-04-11 VITALS — BP 104/60

## 2017-04-11 DIAGNOSIS — M810 Age-related osteoporosis without current pathological fracture: Secondary | ICD-10-CM

## 2017-04-11 MED ORDER — ALENDRONATE SODIUM 70 MG PO TABS: 70.0000 mg | ORAL_TABLET | ORAL | 11 refills | Status: DC

## 2017-04-11 NOTE — Patient Instructions (Signed)
Start on alendronate (Fosamax) 1 pill weekly as we discussed.  Call me if you have any issues with this.

## 2017-04-11 NOTE — Progress Notes (Signed)
    Jay Schlichterkaterina Weathington [birth year] [id number]        52 y.o.  Gwenyth BenderG3P3 presents to discuss her most recent bone density which shows osteoporosis T score -3.8 AP spine.  Statistically significant loss at the spine and both hips since her last DEXA 2013.  Secondary workup includes a normal TSH and PTH.  Vitamin D level found low at 26.  She is just finished a course of 50,000 units weekly.  Also with mildly elevated 24-hour urine calcium excretion at 281 mg per 24 hours.  Patient is active with no history of fractures.  Past medical history,surgical history, problem list, medications, allergies, family history and social history were all reviewed and documented in the EPIC chart.  Directed ROS with pertinent positives and negatives documented in the history of present illness/assessment and plan.  Exam: Vitals:   04/11/17 1428  BP: 104/60   General appearance:  Normal   Assessment/Plan:  52 y.o. G3P3 with osteoporosis.  Secondary workup shows low vitamin D and slightly elevated 24-hour urine calcium excretion.  I reviewed her increased risk of fracture and her increasing risk of fracture as she ages.  She just finished extra vitamin D and I recommended she go on 2000 units daily.  She can have her vitamin D level checked at her primary physician's office.  I discussed whether to treat her excessive calcium excretion at this time or wait until she is within the normal vitamin D range and repeating it in 6 months or so.  She would prefer the repeating it at 6 months.  Treatment with low-dose hydrochlorothiazide was reviewed with her if remains elevated.  We discussed treatment options for her osteoporosis to include bisphosphate's, Prolia, Evista and Forteo.  The pros and cons of each choice discussed.  Risks to include GERD, osteonecrosis of the jaw and atypical fractures particularly with prolonged use also discussed.  At this point the patient wants to go ahead and start alendronate 70 mg weekly.  We  discussed how to take the medication and what to look out for.  Should go ahead and start that now and we will plan on repeating her bone density in 2 years.  We will do a follow-up 24-hour urine calcium excretion collection in 6 months or so.  She will call me if she has any issues once starting the medication.  She will have her vitamin D level rechecked at her primary physician's office.  If again remains low then we will get more aggressive and replacement to include 50,000 units twice weekly.  Greater than 50% of my 20-minute visit was spent in direct face to face counseling and coordination of care with the patient.     Dara Lordsimothy P Rozelia Catapano MD, 2:58 PM 04/11/2017

## 2017-05-10 DIAGNOSIS — Z1211 Encounter for screening for malignant neoplasm of colon: Secondary | ICD-10-CM | POA: Diagnosis not present

## 2017-05-10 DIAGNOSIS — R110 Nausea: Secondary | ICD-10-CM | POA: Diagnosis not present

## 2017-05-10 DIAGNOSIS — R1013 Epigastric pain: Secondary | ICD-10-CM | POA: Diagnosis not present

## 2017-05-10 DIAGNOSIS — K625 Hemorrhage of anus and rectum: Secondary | ICD-10-CM | POA: Diagnosis not present

## 2017-05-16 DIAGNOSIS — R1013 Epigastric pain: Secondary | ICD-10-CM | POA: Diagnosis not present

## 2017-05-16 DIAGNOSIS — K625 Hemorrhage of anus and rectum: Secondary | ICD-10-CM | POA: Diagnosis not present

## 2017-05-16 DIAGNOSIS — R110 Nausea: Secondary | ICD-10-CM | POA: Diagnosis not present

## 2017-05-16 DIAGNOSIS — Z1211 Encounter for screening for malignant neoplasm of colon: Secondary | ICD-10-CM | POA: Diagnosis not present

## 2017-05-16 DIAGNOSIS — R112 Nausea with vomiting, unspecified: Secondary | ICD-10-CM | POA: Diagnosis not present

## 2017-06-13 DIAGNOSIS — F331 Major depressive disorder, recurrent, moderate: Secondary | ICD-10-CM | POA: Diagnosis not present

## 2017-06-27 DIAGNOSIS — H53429 Scotoma of blind spot area, unspecified eye: Secondary | ICD-10-CM | POA: Diagnosis not present

## 2017-06-27 DIAGNOSIS — H401231 Low-tension glaucoma, bilateral, mild stage: Secondary | ICD-10-CM | POA: Diagnosis not present

## 2017-06-27 DIAGNOSIS — H40053 Ocular hypertension, bilateral: Secondary | ICD-10-CM | POA: Diagnosis not present

## 2017-06-27 DIAGNOSIS — H04123 Dry eye syndrome of bilateral lacrimal glands: Secondary | ICD-10-CM | POA: Diagnosis not present

## 2017-07-24 ENCOUNTER — Encounter: Payer: Self-pay | Admitting: Gynecology

## 2017-07-24 ENCOUNTER — Other Ambulatory Visit: Payer: Self-pay

## 2017-07-24 MED ORDER — ALENDRONATE SODIUM 70 MG PO TABS: 70.0000 mg | ORAL_TABLET | ORAL | 3 refills | Status: DC

## 2017-07-24 NOTE — Telephone Encounter (Signed)
Patient e-mailed that she needs 90 day Rx on her Fosamax. Rx sent.

## 2017-09-12 ENCOUNTER — Encounter: Payer: Self-pay | Admitting: Women's Health

## 2017-09-12 ENCOUNTER — Telehealth: Payer: Self-pay | Admitting: *Deleted

## 2017-09-12 ENCOUNTER — Encounter: Payer: Self-pay | Admitting: *Deleted

## 2017-09-12 ENCOUNTER — Ambulatory Visit: Payer: BLUE CROSS/BLUE SHIELD | Admitting: Women's Health

## 2017-09-12 VITALS — BP 124/80

## 2017-09-12 DIAGNOSIS — N644 Mastodynia: Secondary | ICD-10-CM

## 2017-09-12 NOTE — Progress Notes (Signed)
52 year old MWF G3, P2 presents with complaint of right breast tenderness outer quadrant.  States feels heavier with fullness for the past few weeks and now  tender.  Normal mammogram 11/2016.  Postmenopausal on no HRT with no bleeding.  Denies palpable change, nipple discharge, injury, change in routine or diet.  minimal caffeine.  Osteoporosis on Fosamax.  Exam: Appears well.  Breast examined sitting and lying position without visible dimpling, erythema, nipple discharge, or palpable changes, tenderness diffuse entire right outer quadrant.  Right breast tenderness with subjective fullness/heavy feeling  Plan: Diagnostic right breast mammogram with ultrasound.  Will get scheduled.  Vitamin E twice daily.

## 2017-09-12 NOTE — Telephone Encounter (Signed)
-----   Message from Harrington ChallengerNancy J Young, NP sent at 09/12/2017  3:19 PM EDT ----- Needs rt breast diagnostic mammogram diffuse tenderness outer quad, no palpable nodule but pt states feels "full and heavy"  Need wed august 21, any time   She is a pediatrician

## 2017-09-12 NOTE — Patient Instructions (Signed)

## 2017-09-12 NOTE — Telephone Encounter (Signed)
Patient scheduled on 09/19/17 @ 8:20am sent my chart message with time and date.

## 2017-09-19 ENCOUNTER — Ambulatory Visit
Admission: RE | Admit: 2017-09-19 | Discharge: 2017-09-19 | Disposition: A | Discharge status: Home / Self Care | Payer: BLUE CROSS/BLUE SHIELD | Source: Ambulatory Visit | Attending: Women's Health | Admitting: Women's Health

## 2017-09-19 DIAGNOSIS — R922 Inconclusive mammogram: Secondary | ICD-10-CM | POA: Diagnosis not present

## 2017-09-19 DIAGNOSIS — N6002 Solitary cyst of left breast: Secondary | ICD-10-CM | POA: Diagnosis not present

## 2017-09-19 DIAGNOSIS — N644 Mastodynia: Secondary | ICD-10-CM

## 2017-10-03 DIAGNOSIS — H401231 Low-tension glaucoma, bilateral, mild stage: Secondary | ICD-10-CM | POA: Diagnosis not present

## 2017-10-03 DIAGNOSIS — H53429 Scotoma of blind spot area, unspecified eye: Secondary | ICD-10-CM | POA: Diagnosis not present

## 2017-10-03 DIAGNOSIS — H04123 Dry eye syndrome of bilateral lacrimal glands: Secondary | ICD-10-CM | POA: Diagnosis not present

## 2017-10-03 DIAGNOSIS — H40053 Ocular hypertension, bilateral: Secondary | ICD-10-CM | POA: Diagnosis not present

## 2017-10-10 DIAGNOSIS — Z23 Encounter for immunization: Secondary | ICD-10-CM | POA: Diagnosis not present

## 2017-10-10 DIAGNOSIS — M810 Age-related osteoporosis without current pathological fracture: Secondary | ICD-10-CM | POA: Diagnosis not present

## 2017-10-10 DIAGNOSIS — R799 Abnormal finding of blood chemistry, unspecified: Secondary | ICD-10-CM | POA: Diagnosis not present

## 2017-10-10 DIAGNOSIS — Z0000 Encounter for general adult medical examination without abnormal findings: Secondary | ICD-10-CM | POA: Diagnosis not present

## 2017-10-24 DIAGNOSIS — E039 Hypothyroidism, unspecified: Secondary | ICD-10-CM | POA: Diagnosis not present

## 2017-10-24 DIAGNOSIS — M810 Age-related osteoporosis without current pathological fracture: Secondary | ICD-10-CM | POA: Diagnosis not present

## 2017-10-24 DIAGNOSIS — Z0000 Encounter for general adult medical examination without abnormal findings: Secondary | ICD-10-CM | POA: Diagnosis not present

## 2017-10-24 DIAGNOSIS — E7800 Pure hypercholesterolemia, unspecified: Secondary | ICD-10-CM | POA: Diagnosis not present

## 2017-11-20 ENCOUNTER — Other Ambulatory Visit: Payer: Self-pay | Admitting: Women's Health

## 2017-11-20 DIAGNOSIS — Z1231 Encounter for screening mammogram for malignant neoplasm of breast: Secondary | ICD-10-CM

## 2017-12-12 DIAGNOSIS — H2513 Age-related nuclear cataract, bilateral: Secondary | ICD-10-CM | POA: Diagnosis not present

## 2017-12-12 DIAGNOSIS — H35373 Puckering of macula, bilateral: Secondary | ICD-10-CM | POA: Diagnosis not present

## 2017-12-12 DIAGNOSIS — H401231 Low-tension glaucoma, bilateral, mild stage: Secondary | ICD-10-CM | POA: Diagnosis not present

## 2017-12-12 DIAGNOSIS — H35439 Paving stone degeneration of retina, unspecified eye: Secondary | ICD-10-CM | POA: Diagnosis not present

## 2018-01-02 ENCOUNTER — Encounter: Payer: Self-pay | Admitting: Women's Health

## 2018-01-02 ENCOUNTER — Ambulatory Visit (INDEPENDENT_AMBULATORY_CARE_PROVIDER_SITE_OTHER): Payer: BLUE CROSS/BLUE SHIELD | Admitting: Women's Health

## 2018-01-02 VITALS — BP 126/78 | Ht 62.0 in | Wt 126.0 lb

## 2018-01-02 DIAGNOSIS — Z01419 Encounter for gynecological examination (general) (routine) without abnormal findings: Secondary | ICD-10-CM | POA: Diagnosis not present

## 2018-01-02 MED ORDER — ALENDRONATE SODIUM 70 MG PO TABS: 70.0000 mg | ORAL_TABLET | ORAL | 3 refills | Status: DC

## 2018-01-02 NOTE — Patient Instructions (Addendum)
Red rice yeast supp daily  Health Maintenance for Postmenopausal Women Menopause is a normal process in which your reproductive ability comes to an end. This process happens gradually over a span of months to years, usually between the ages of 1 and 30. Menopause is complete when you have missed 12 consecutive menstrual periods. It is important to talk with your health care provider about some of the most common conditions that affect postmenopausal women, such as heart disease, cancer, and bone loss (osteoporosis). Adopting a healthy lifestyle and getting preventive care can help to promote your health and wellness. Those actions can also lower your chances of developing some of these common conditions. What should I know about menopause? During menopause, you may experience a number of symptoms, such as:  Moderate-to-severe hot flashes.  Night sweats.  Decrease in sex drive.  Mood swings.  Headaches.  Tiredness.  Irritability.  Memory problems.  Insomnia.  Choosing to treat or not to treat menopausal changes is an individual decision that you make with your health care provider. What should I know about hormone replacement therapy and supplements? Hormone therapy products are effective for treating symptoms that are associated with menopause, such as hot flashes and night sweats. Hormone replacement carries certain risks, especially as you become older. If you are thinking about using estrogen or estrogen with progestin treatments, discuss the benefits and risks with your health care provider. What should I know about heart disease and stroke? Heart disease, heart attack, and stroke become more likely as you age. This may be due, in part, to the hormonal changes that your body experiences during menopause. These can affect how your body processes dietary fats, triglycerides, and cholesterol. Heart attack and stroke are both medical emergencies. There are many things that you can do  to help prevent heart disease and stroke:  Have your blood pressure checked at least every 1-2 years. High blood pressure causes heart disease and increases the risk of stroke.  If you are 35-84 years old, ask your health care provider if you should take aspirin to prevent a heart attack or a stroke.  Do not use any tobacco products, including cigarettes, chewing tobacco, or electronic cigarettes. If you need help quitting, ask your health care provider.  It is important to eat a healthy diet and maintain a healthy weight. ? Be sure to include plenty of vegetables, fruits, low-fat dairy products, and lean protein. ? Avoid eating foods that are high in solid fats, added sugars, or salt (sodium).  Get regular exercise. This is one of the most important things that you can do for your health. ? Try to exercise for at least 150 minutes each week. The type of exercise that you do should increase your heart rate and make you sweat. This is known as moderate-intensity exercise. ? Try to do strengthening exercises at least twice each week. Do these in addition to the moderate-intensity exercise.  Know your numbers.Ask your health care provider to check your cholesterol and your blood glucose. Continue to have your blood tested as directed by your health care provider.  What should I know about cancer screening? There are several types of cancer. Take the following steps to reduce your risk and to catch any cancer development as early as possible. Breast Cancer  Practice breast self-awareness. ? This means understanding how your breasts normally appear and feel. ? It also means doing regular breast self-exams. Let your health care provider know about any changes, no matter how  small.  If you are 40 or older, have a clinician do a breast exam (clinical breast exam or CBE) every year. Depending on your age, family history, and medical history, it may be recommended that you also have a yearly breast  X-ray (mammogram).  If you have a family history of breast cancer, talk with your health care provider about genetic screening.  If you are at high risk for breast cancer, talk with your health care provider about having an MRI and a mammogram every year.  Breast cancer (BRCA) gene test is recommended for women who have family members with BRCA-related cancers. Results of the assessment will determine the need for genetic counseling and BRCA1 and for BRCA2 testing. BRCA-related cancers include these types: ? Breast. This occurs in males or females. ? Ovarian. ? Tubal. This may also be called fallopian tube cancer. ? Cancer of the abdominal or pelvic lining (peritoneal cancer). ? Prostate. ? Pancreatic.  Cervical, Uterine, and Ovarian Cancer Your health care provider may recommend that you be screened regularly for cancer of the pelvic organs. These include your ovaries, uterus, and vagina. This screening involves a pelvic exam, which includes checking for microscopic changes to the surface of your cervix (Pap test).  For women ages 21-65, health care providers may recommend a pelvic exam and a Pap test every three years. For women ages 60-65, they may recommend the Pap test and pelvic exam, combined with testing for human papilloma virus (HPV), every five years. Some types of HPV increase your risk of cervical cancer. Testing for HPV may also be done on women of any age who have unclear Pap test results.  Other health care providers may not recommend any screening for nonpregnant women who are considered low risk for pelvic cancer and have no symptoms. Ask your health care provider if a screening pelvic exam is right for you.  If you have had past treatment for cervical cancer or a condition that could lead to cancer, you need Pap tests and screening for cancer for at least 20 years after your treatment. If Pap tests have been discontinued for you, your risk factors (such as having a new sexual  partner) need to be reassessed to determine if you should start having screenings again. Some women have medical problems that increase the chance of getting cervical cancer. In these cases, your health care provider may recommend that you have screening and Pap tests more often.  If you have a family history of uterine cancer or ovarian cancer, talk with your health care provider about genetic screening.  If you have vaginal bleeding after reaching menopause, tell your health care provider.  There are currently no reliable tests available to screen for ovarian cancer.  Lung Cancer Lung cancer screening is recommended for adults 98-86 years old who are at high risk for lung cancer because of a history of smoking. A yearly low-dose CT scan of the lungs is recommended if you:  Currently smoke.  Have a history of at least 30 pack-years of smoking and you currently smoke or have quit within the past 15 years. A pack-year is smoking an average of one pack of cigarettes per day for one year.  Yearly screening should:  Continue until it has been 15 years since you quit.  Stop if you develop a health problem that would prevent you from having lung cancer treatment.  Colorectal Cancer  This type of cancer can be detected and can often be prevented.  Routine  colorectal cancer screening usually begins at age 24 and continues through age 68.  If you have risk factors for colon cancer, your health care provider may recommend that you be screened at an earlier age.  If you have a family history of colorectal cancer, talk with your health care provider about genetic screening.  Your health care provider may also recommend using home test kits to check for hidden blood in your stool.  A small camera at the end of a tube can be used to examine your colon directly (sigmoidoscopy or colonoscopy). This is done to check for the earliest forms of colorectal cancer.  Direct examination of the colon  should be repeated every 5-10 years until age 41. However, if early forms of precancerous polyps or small growths are found or if you have a family history or genetic risk for colorectal cancer, you may need to be screened more often.  Skin Cancer  Check your skin from head to toe regularly.  Monitor any moles. Be sure to tell your health care provider: ? About any new moles or changes in moles, especially if there is a change in a mole's shape or color. ? If you have a mole that is larger than the size of a pencil eraser.  If any of your family members has a history of skin cancer, especially at a Modene Andy age, talk with your health care provider about genetic screening.  Always use sunscreen. Apply sunscreen liberally and repeatedly throughout the day.  Whenever you are outside, protect yourself by wearing long sleeves, pants, a wide-brimmed hat, and sunglasses.  What should I know about osteoporosis? Osteoporosis is a condition in which bone destruction happens more quickly than new bone creation. After menopause, you may be at an increased risk for osteoporosis. To help prevent osteoporosis or the bone fractures that can happen because of osteoporosis, the following is recommended:  If you are 38-12 years old, get at least 1,000 mg of calcium and at least 600 mg of vitamin D per day.  If you are older than age 51 but younger than age 46, get at least 1,200 mg of calcium and at least 600 mg of vitamin D per day.  If you are older than age 43, get at least 1,200 mg of calcium and at least 800 mg of vitamin D per day.  Smoking and excessive alcohol intake increase the risk of osteoporosis. Eat foods that are rich in calcium and vitamin D, and do weight-bearing exercises several times each week as directed by your health care provider. What should I know about how menopause affects my mental health? Depression may occur at any age, but it is more common as you become older. Common symptoms of  depression include:  Low or sad mood.  Changes in sleep patterns.  Changes in appetite or eating patterns.  Feeling an overall lack of motivation or enjoyment of activities that you previously enjoyed.  Frequent crying spells.  Talk with your health care provider if you think that you are experiencing depression. What should I know about immunizations? It is important that you get and maintain your immunizations. These include:  Tetanus, diphtheria, and pertussis (Tdap) booster vaccine.  Influenza every year before the flu season begins.  Pneumonia vaccine.  Shingles vaccine.  Your health care provider may also recommend other immunizations. This information is not intended to replace advice given to you by your health care provider. Make sure you discuss any questions you have with your  health care provider. Document Released: 03/10/2005 Document Revised: 08/06/2015 Document Reviewed: 10/20/2014 Elsevier Interactive Patient Education  2018 [employer].

## 2018-01-02 NOTE — Addendum Note (Signed)
Addended by: Tito DineBONHAM, KIM A on: 01/02/2018 10:31 AM   Modules accepted: Orders

## 2018-01-02 NOTE — Progress Notes (Signed)
Dawn Macdonald 07-10-1965 [id number]    History:    Presents for annual exam.  Postmenopausal on no HRT with no bleeding.  Osteoporosis T score -3.3 at spine started on Fosamax weekly 03/2017.  Hypothyroid, anxiety/depression managed by primary care.  Normal Pap with cryo in her 20s normal Paps after.  Normal mammogram history, history of ultrasound with mammogram.  2019- colonoscopy.  Past medical history, past surgical history, family history and social history were all reviewed and documented in the EPIC chart.  Pediatrician.  2 sons.  ROS:  A ROS was performed and pertinent positives and negatives are included.  Exam:  Vitals:   01/02/18 0907  BP: 126/78  Weight: 126 lb (57.2 kg)  Height: 5\' 2"  (1.575 m)   Body mass index is 23.05 kg/m.   General appearance:  Normal Thyroid:  Symmetrical, normal in size, without palpable masses or nodularity. Respiratory  Auscultation:  Clear without wheezing or rhonchi Cardiovascular  Auscultation:  Regular rate, without rubs, murmurs or gallops  Edema/varicosities:  Not grossly evident Abdominal  Soft,nontender, without masses, guarding or rebound.  Liver/spleen:  No organomegaly noted  Hernia:  None appreciated  Skin  Inspection:  Grossly normal   Breasts: Examined lying and sitting.     Right: Without masses, retractions, discharge or axillary adenopathy.     Left: Without masses, retractions, discharge or axillary adenopathy. Gentitourinary   Inguinal/mons:  Normal without inguinal adenopathy  External genitalia:  Normal  BUS/Urethra/Skene's glands:  Normal  Vagina:  Normal  Cervix:  Normal  Uterus: normal in size, shape and contour.  Midline and mobile  Adnexa/parametria:     Rt: Without masses or tenderness.   Lt: Without masses or tenderness.  Anus and perineum: Normal  Digital rectal exam: Normal sphincter tone without palpated masses or tenderness  Assessment/Plan:  52 y.o. MWF G3 P2 for annual exam with no  complaints.  Osteoporosis on Fosamax tolerating well Postmenopausal/no HRT/no bleeding Hypothyroid, depression-primary care manages labs and meds  Plan: Fosamax 70 mg p.o. weekly prescription, proper use given and reviewed.  SBEs, continue annual screening mammogram scheduled next week.  Continue healthy lifestyle of regular exercise, healthy diet/vegetarian, vitamin D 2000 daily encouraged.  Pap with HR HPV typing, new screening guidelines reviewed.    Harrington Challengerancy J Flem Enderle [facility] CenterWHNP, 9:24 AM 01/02/2018

## 2018-01-04 LAB — PAP, TP IMAGING W/ HPV RNA, RFLX HPV TYPE 16,18/45: HPV DNA HIGH RISK: NOT DETECTED

## 2018-01-09 ENCOUNTER — Ambulatory Visit
Admission: RE | Admit: 2018-01-09 | Discharge: 2018-01-09 | Disposition: A | Discharge status: Home / Self Care | Payer: BLUE CROSS/BLUE SHIELD | Source: Ambulatory Visit | Attending: Women's Health | Admitting: Women's Health

## 2018-01-09 DIAGNOSIS — Z1231 Encounter for screening mammogram for malignant neoplasm of breast: Secondary | ICD-10-CM | POA: Diagnosis not present

## 2018-01-16 DIAGNOSIS — D1801 Hemangioma of skin and subcutaneous tissue: Secondary | ICD-10-CM | POA: Diagnosis not present

## 2018-01-16 DIAGNOSIS — D225 Melanocytic nevi of trunk: Secondary | ICD-10-CM | POA: Diagnosis not present

## 2018-01-16 DIAGNOSIS — L821 Other seborrheic keratosis: Secondary | ICD-10-CM | POA: Diagnosis not present

## 2018-03-23 ENCOUNTER — Other Ambulatory Visit: Payer: Self-pay | Admitting: Women's Health

## 2018-03-23 DIAGNOSIS — E038 Other specified hypothyroidism: Secondary | ICD-10-CM

## 2018-06-12 DIAGNOSIS — F331 Major depressive disorder, recurrent, moderate: Secondary | ICD-10-CM | POA: Diagnosis not present

## 2018-06-26 DIAGNOSIS — H401230 Low-tension glaucoma, bilateral, stage unspecified: Secondary | ICD-10-CM | POA: Diagnosis not present

## 2018-06-26 DIAGNOSIS — H04123 Dry eye syndrome of bilateral lacrimal glands: Secondary | ICD-10-CM | POA: Diagnosis not present

## 2018-06-26 DIAGNOSIS — H0288A Meibomian gland dysfunction right eye, upper and lower eyelids: Secondary | ICD-10-CM | POA: Diagnosis not present

## 2018-06-26 DIAGNOSIS — H0288B Meibomian gland dysfunction left eye, upper and lower eyelids: Secondary | ICD-10-CM | POA: Diagnosis not present

## 2018-09-01 DIAGNOSIS — S83242A Other tear of medial meniscus, current injury, left knee, initial encounter: Secondary | ICD-10-CM | POA: Diagnosis not present

## 2018-09-11 ENCOUNTER — Ambulatory Visit: Payer: BLUE CROSS/BLUE SHIELD | Admitting: Orthopaedic Surgery

## 2018-09-17 ENCOUNTER — Other Ambulatory Visit: Payer: Self-pay | Admitting: Women's Health

## 2018-09-17 ENCOUNTER — Encounter: Payer: Self-pay | Admitting: Women's Health

## 2018-09-17 DIAGNOSIS — E038 Other specified hypothyroidism: Secondary | ICD-10-CM

## 2018-09-17 MED ORDER — LEVOTHYROXINE SODIUM 112 MCG PO TABS: 112.0000 ug | ORAL_TABLET | Freq: Every day | ORAL | 0 refills | Status: DC

## 2018-09-17 MED ORDER — LEVOTHYROXINE SODIUM 112 MCG PO TABS: 112.0000 ug | ORAL_TABLET | Freq: Every day | ORAL | 1 refills | Status: DC

## 2018-09-17 NOTE — Telephone Encounter (Signed)
Ok to refill as requested. Has CE scheduled 01/07/19.  Last TSH was checked in 2018 here.

## 2018-10-04 DIAGNOSIS — Z23 Encounter for immunization: Secondary | ICD-10-CM | POA: Diagnosis not present

## 2018-10-14 ENCOUNTER — Other Ambulatory Visit: Payer: Self-pay | Admitting: Women's Health

## 2018-10-14 DIAGNOSIS — E038 Other specified hypothyroidism: Secondary | ICD-10-CM

## 2018-10-23 ENCOUNTER — Encounter: Payer: Self-pay | Admitting: Gynecology

## 2018-10-30 DIAGNOSIS — E039 Hypothyroidism, unspecified: Secondary | ICD-10-CM | POA: Diagnosis not present

## 2018-10-30 DIAGNOSIS — E7800 Pure hypercholesterolemia, unspecified: Secondary | ICD-10-CM | POA: Diagnosis not present

## 2018-10-30 DIAGNOSIS — E559 Vitamin D deficiency, unspecified: Secondary | ICD-10-CM | POA: Diagnosis not present

## 2018-10-30 DIAGNOSIS — Z0000 Encounter for general adult medical examination without abnormal findings: Secondary | ICD-10-CM | POA: Diagnosis not present

## 2018-11-06 DIAGNOSIS — E7800 Pure hypercholesterolemia, unspecified: Secondary | ICD-10-CM | POA: Diagnosis not present

## 2018-11-06 DIAGNOSIS — Z0000 Encounter for general adult medical examination without abnormal findings: Secondary | ICD-10-CM | POA: Diagnosis not present

## 2018-12-10 ENCOUNTER — Other Ambulatory Visit: Payer: Self-pay | Admitting: Women's Health

## 2018-12-10 DIAGNOSIS — Z1231 Encounter for screening mammogram for malignant neoplasm of breast: Secondary | ICD-10-CM

## 2019-01-08 ENCOUNTER — Other Ambulatory Visit: Payer: Self-pay

## 2019-01-08 ENCOUNTER — Ambulatory Visit (INDEPENDENT_AMBULATORY_CARE_PROVIDER_SITE_OTHER): Payer: BC Managed Care – PPO | Admitting: Women's Health

## 2019-01-08 ENCOUNTER — Encounter: Payer: Self-pay | Admitting: Women's Health

## 2019-01-08 VITALS — BP 110/78 | Ht 62.0 in | Wt 129.0 lb

## 2019-01-08 DIAGNOSIS — E7801 Familial hypercholesterolemia: Secondary | ICD-10-CM | POA: Diagnosis not present

## 2019-01-08 DIAGNOSIS — Z01419 Encounter for gynecological examination (general) (routine) without abnormal findings: Secondary | ICD-10-CM

## 2019-01-08 MED ORDER — ALENDRONATE SODIUM 70 MG PO TABS: 70.0000 mg | ORAL_TABLET | ORAL | 3 refills | Status: DC

## 2019-01-08 NOTE — Progress Notes (Signed)
Dawn Macdonald Oct 27, 1965 [id number]    History:    Presents for annual exam.  Postmenopausal on no HRT with no bleeding.  Abnormal Pap greater than 30 years ago prior normal Paps after.  Normal  mammogram history.  2019 - colonoscopy.  Hypothyroidism, hypercholesteremia, anxiety and depression primary care manages labs and meds.  Has had Zostavax plans to get Shingrix.  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  Mother hypercholesteremia.  ROS:  A ROS was performed and pertinent positives and negatives are included.  Exam:  Vitals:   01/08/19 0900  BP: 110/78  Weight: 129 lb (58.5 kg)  Height: 5\' 2"  (1.575 m)   Body mass index is 23.59 kg/m.   General appearance:  Normal Thyroid:  Symmetrical, normal in size, without palpable masses or nodularity. Respiratory  Auscultation:  Clear without wheezing or rhonchi Cardiovascular  Auscultation:  Regular rate, without rubs, murmurs or gallops  Edema/varicosities:  Not grossly evident Abdominal  Soft,nontender, without masses, guarding or rebound.  Liver/spleen:  No organomegaly noted  Hernia:  None appreciated  Skin  Inspection:  Grossly normal   Breasts: Examined lying and sitting.     Right: Without masses, retractions, discharge or axillary adenopathy.     Left: Without masses, retractions, discharge or axillary adenopathy. Gentitourinary   Inguinal/mons:  Normal without inguinal adenopathy  External genitalia:  Normal  BUS/Urethra/Skene's glands:  Normal  Vagina:  Normal  Cervix:  Normal  Uterus:  normal in size, shape and contour.  Midline and mobile  Adnexa/parametria:     Rt: Without masses or tenderness.   Lt: Without masses or tenderness.  Anus and perineum: Normal  Digital rectal exam: Normal sphincter tone without palpated masses or tenderness  Assessment/Plan:   53 y.o. MWF G3 P2 for annual exam with no complaints.  Postmenopausal/no bleeding/no HRT Osteoporosis on Fosamax started 03/2017-tolerating Hypothyroidism, hypercholesteremia, anxiety/depression-primary care manages labs and meds  Plan: Fosamax 70 mg p.o. weekly prescription, proper use given and reviewed.  Tolerating well, history of slightly elevated calcium excretion and 24-hour urine will repeat.  SBEs, continue annual 3D screening mammogram, calcium rich foods, vitamin D 2000 daily encouraged.  Continue healthy lifestyle of regular exercise, healthy diet.  Home safety and fall prevention discussed.  Self-care, leisure activities encouraged.  Pap normal 2019, new screening guidelines reviewed.      [place], 9:24 AM 01/08/2019

## 2019-01-08 NOTE — Patient Instructions (Signed)
Good to see you! Vit D 2000 iu daily  Health Maintenance for Postmenopausal Women Menopause is a normal process in which your ability to get pregnant comes to an end. This process happens slowly over many months or years, usually between the ages of 48 and 55. Menopause is complete when you have missed your menstrual periods for 12 months. It is important to talk with your health care provider about some of the most common conditions that affect women after menopause (postmenopausal women). These include heart disease, cancer, and bone loss (osteoporosis). Adopting a healthy lifestyle and getting preventive care can help to promote your health and wellness. The actions you take can also lower your chances of developing some of these common conditions. What should I know about menopause? During menopause, you may get a number of symptoms, such as:  Hot flashes. These can be moderate or severe.  Night sweats.  Decrease in sex drive.  Mood swings.  Headaches.  Tiredness.  Irritability.  Memory problems.  Insomnia. Choosing to treat or not to treat these symptoms is a decision that you make with your health care provider. Do I need hormone replacement therapy?  Hormone replacement therapy is effective in treating symptoms that are caused by menopause, such as hot flashes and night sweats.  Hormone replacement carries certain risks, especially as you become older. If you are thinking about using estrogen or estrogen with progestin, discuss the benefits and risks with your health care provider. What is my risk for heart disease and stroke? The risk of heart disease, heart attack, and stroke increases as you age. One of the causes may be a change in the body's hormones during menopause. This can affect how your body uses dietary fats, triglycerides, and cholesterol. Heart attack and stroke are medical emergencies. There are many things that you can do to help prevent heart disease and  stroke. Watch your blood pressure  High blood pressure causes heart disease and increases the risk of stroke. This is more likely to develop in people who have high blood pressure readings, are of African descent, or are overweight.  Have your blood pressure checked: ? Every 3-5 years if you are 18-39 years of age. ? Every year if you are 40 years old or older. Eat a healthy diet   Eat a diet that includes plenty of vegetables, fruits, low-fat dairy products, and lean protein.  Do not eat a lot of foods that are high in solid fats, added sugars, or sodium. Get regular exercise Get regular exercise. This is one of the most important things you can do for your health. Most adults should:  Try to exercise for at least 150 minutes each week. The exercise should increase your heart rate and make you sweat (moderate-intensity exercise).  Try to do strengthening exercises at least twice each week. Do these in addition to the moderate-intensity exercise.  Spend less time sitting. Even light physical activity can be beneficial. Other tips  Work with your health care provider to achieve or maintain a healthy weight.  Do not use any products that contain nicotine or tobacco, such as cigarettes, e-cigarettes, and chewing tobacco. If you need help quitting, ask your health care provider.  Know your numbers. Ask your health care provider to check your cholesterol and your blood sugar (glucose). Continue to have your blood tested as directed by your health care provider. Do I need screening for cancer? Depending on your health history and family history, you may need to   have cancer screening at different stages of your life. This may include screening for:  Breast cancer.  Cervical cancer.  Lung cancer.  Colorectal cancer. What is my risk for osteoporosis? After menopause, you may be at increased risk for osteoporosis. Osteoporosis is a condition in which bone destruction happens more  quickly than new bone creation. To help prevent osteoporosis or the bone fractures that can happen because of osteoporosis, you may take the following actions:  If you are 19-50 years old, get at least 1,000 mg of calcium and at least 600 mg of vitamin D per day.  If you are older than age 50 but younger than age 70, get at least 1,200 mg of calcium and at least 600 mg of vitamin D per day.  If you are older than age 70, get at least 1,200 mg of calcium and at least 800 mg of vitamin D per day. Smoking and drinking excessive alcohol increase the risk of osteoporosis. Eat foods that are rich in calcium and vitamin D, and do weight-bearing exercises several times each week as directed by your health care provider. How does menopause affect my mental health? Depression may occur at any age, but it is more common as you become older. Common symptoms of depression include:  Low or sad mood.  Changes in sleep patterns.  Changes in appetite or eating patterns.  Feeling an overall lack of motivation or enjoyment of activities that you previously enjoyed.  Frequent crying spells. Talk with your health care provider if you think that you are experiencing depression. General instructions See your health care provider for regular wellness exams and vaccines. This may include:  Scheduling regular health, dental, and eye exams.  Getting and maintaining your vaccines. These include: ? Influenza vaccine. Get this vaccine each year before the flu season begins. ? Pneumonia vaccine. ? Shingles vaccine. ? Tetanus, diphtheria, and pertussis (Tdap) booster vaccine. Your health care provider may also recommend other immunizations. Tell your health care provider if you have ever been abused or do not feel safe at home. Summary  Menopause is a normal process in which your ability to get pregnant comes to an end.  This condition causes hot flashes, night sweats, decreased interest in sex, mood swings,  headaches, or lack of sleep.  Treatment for this condition may include hormone replacement therapy.  Take actions to keep yourself healthy, including exercising regularly, eating a healthy diet, watching your weight, and checking your blood pressure and blood sugar levels.  Get screened for cancer and depression. Make sure that you are up to date with all your vaccines. This information is not intended to replace advice given to you by your health care provider. Make sure you discuss any questions you have with your health care provider. Document Released: 03/10/2005 Document Revised: 01/09/2018 Document Reviewed: 01/09/2018 Elsevier Patient Education  2020 [employer]  

## 2019-01-15 DIAGNOSIS — H43392 Other vitreous opacities, left eye: Secondary | ICD-10-CM | POA: Diagnosis not present

## 2019-01-15 DIAGNOSIS — H2513 Age-related nuclear cataract, bilateral: Secondary | ICD-10-CM | POA: Diagnosis not present

## 2019-01-15 DIAGNOSIS — H401230 Low-tension glaucoma, bilateral, stage unspecified: Secondary | ICD-10-CM | POA: Diagnosis not present

## 2019-01-15 DIAGNOSIS — H3562 Retinal hemorrhage, left eye: Secondary | ICD-10-CM | POA: Diagnosis not present

## 2019-02-05 ENCOUNTER — Ambulatory Visit
Admission: RE | Admit: 2019-02-05 | Discharge: 2019-02-05 | Disposition: A | Discharge status: Home / Self Care | Payer: BC Managed Care – PPO | Source: Ambulatory Visit | Attending: Women's Health | Admitting: Women's Health

## 2019-02-05 ENCOUNTER — Other Ambulatory Visit: Payer: Self-pay

## 2019-02-05 DIAGNOSIS — Z1231 Encounter for screening mammogram for malignant neoplasm of breast: Secondary | ICD-10-CM

## 2019-02-05 DIAGNOSIS — E7800 Pure hypercholesterolemia, unspecified: Secondary | ICD-10-CM | POA: Diagnosis not present

## 2019-03-05 DIAGNOSIS — L814 Other melanin hyperpigmentation: Secondary | ICD-10-CM | POA: Diagnosis not present

## 2019-03-05 DIAGNOSIS — L821 Other seborrheic keratosis: Secondary | ICD-10-CM | POA: Diagnosis not present

## 2019-03-05 DIAGNOSIS — D485 Neoplasm of uncertain behavior of skin: Secondary | ICD-10-CM | POA: Diagnosis not present

## 2019-03-05 DIAGNOSIS — D1801 Hemangioma of skin and subcutaneous tissue: Secondary | ICD-10-CM | POA: Diagnosis not present

## 2019-03-05 DIAGNOSIS — D225 Melanocytic nevi of trunk: Secondary | ICD-10-CM | POA: Diagnosis not present

## 2019-03-16 ENCOUNTER — Other Ambulatory Visit: Payer: Self-pay | Admitting: Women's Health

## 2019-03-16 DIAGNOSIS — E038 Other specified hypothyroidism: Secondary | ICD-10-CM

## 2019-03-17 ENCOUNTER — Encounter: Payer: Self-pay | Admitting: *Deleted

## 2019-03-17 NOTE — Telephone Encounter (Signed)
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  I think she has had this blood work checked and you can ask her to have it faxed to our office.

## 2019-03-17 NOTE — Telephone Encounter (Signed)
Sent mychart message

## 2019-03-17 NOTE — Telephone Encounter (Signed)
You last filled on 10/14/18 #30 with 2 refills.  Last TSH level checked in 2018, will you continue filling Rx?

## 2019-03-18 NOTE — Telephone Encounter (Signed)
Dawn Macdonald patient faxed result, I will place on your desk for medication approval.

## 2019-03-18 NOTE — Telephone Encounter (Signed)
Okay for 90-day supplies with refills.

## 2019-03-18 NOTE — Telephone Encounter (Signed)
Yes ok 

## 2019-05-14 DIAGNOSIS — E7800 Pure hypercholesterolemia, unspecified: Secondary | ICD-10-CM | POA: Diagnosis not present

## 2019-06-18 DIAGNOSIS — H401230 Low-tension glaucoma, bilateral, stage unspecified: Secondary | ICD-10-CM | POA: Diagnosis not present

## 2019-06-23 DIAGNOSIS — F331 Major depressive disorder, recurrent, moderate: Secondary | ICD-10-CM | POA: Diagnosis not present

## 2019-10-28 DIAGNOSIS — Z23 Encounter for immunization: Secondary | ICD-10-CM | POA: Diagnosis not present

## 2019-11-12 DIAGNOSIS — M818 Other osteoporosis without current pathological fracture: Secondary | ICD-10-CM | POA: Diagnosis not present

## 2019-11-12 DIAGNOSIS — Z87891 Personal history of nicotine dependence: Secondary | ICD-10-CM | POA: Diagnosis not present

## 2019-11-12 DIAGNOSIS — E039 Hypothyroidism, unspecified: Secondary | ICD-10-CM | POA: Diagnosis not present

## 2019-11-12 DIAGNOSIS — E7800 Pure hypercholesterolemia, unspecified: Secondary | ICD-10-CM | POA: Diagnosis not present

## 2019-11-12 DIAGNOSIS — E559 Vitamin D deficiency, unspecified: Secondary | ICD-10-CM | POA: Diagnosis not present

## 2019-11-12 DIAGNOSIS — Z23 Encounter for immunization: Secondary | ICD-10-CM | POA: Diagnosis not present

## 2019-11-12 DIAGNOSIS — Z0000 Encounter for general adult medical examination without abnormal findings: Secondary | ICD-10-CM | POA: Diagnosis not present

## 2019-11-12 DIAGNOSIS — Z8342 Family history of familial hypercholesterolemia: Secondary | ICD-10-CM | POA: Diagnosis not present

## 2019-12-29 ENCOUNTER — Other Ambulatory Visit: Payer: Self-pay | Admitting: Obstetrics and Gynecology

## 2019-12-29 DIAGNOSIS — Z1231 Encounter for screening mammogram for malignant neoplasm of breast: Secondary | ICD-10-CM

## 2020-01-01 ENCOUNTER — Other Ambulatory Visit: Payer: Self-pay

## 2020-01-01 MED ORDER — ALENDRONATE SODIUM 70 MG PO TABS: 70.0000 mg | ORAL_TABLET | ORAL | 0 refills | Status: DC

## 2020-01-01 NOTE — Telephone Encounter (Signed)
Has annual exam scheduled 01/14/20 with TW.

## 2020-01-14 ENCOUNTER — Ambulatory Visit (INDEPENDENT_AMBULATORY_CARE_PROVIDER_SITE_OTHER): Payer: BC Managed Care – PPO | Admitting: Nurse Practitioner

## 2020-01-14 ENCOUNTER — Encounter: Payer: Self-pay | Admitting: Nurse Practitioner

## 2020-01-14 ENCOUNTER — Other Ambulatory Visit: Payer: Self-pay

## 2020-01-14 VITALS — BP 118/78 | Ht 62.0 in | Wt 126.0 lb

## 2020-01-14 DIAGNOSIS — M810 Age-related osteoporosis without current pathological fracture: Secondary | ICD-10-CM | POA: Diagnosis not present

## 2020-01-14 DIAGNOSIS — Z01419 Encounter for gynecological examination (general) (routine) without abnormal findings: Secondary | ICD-10-CM | POA: Diagnosis not present

## 2020-01-14 NOTE — Patient Instructions (Signed)

## 2020-01-14 NOTE — Progress Notes (Signed)
   Dawn Macdonald 11-17-1965 [id number]   History:  54 y.o. G 3P3 presents for annual exam. Postmenopausal - no HRT, no bleeding. Abnormal pap greater than 30 years ago. Normal mammogram history. Osteoporosis - on Fosamax since March 2019. Stopped Fosamax 2 weeks ago due to pain. She began having pain in her hands 1 year ago but since then has developed daily generalized bone pain. The pain improved 1 week after discontinuing. Hypothyroidism managed by PCP.   Gynecologic History No LMP recorded. Patient is postmenopausal.   Contraception: post menopausal status Last Pap: 01/02/2018. Results were: normal Last mammogram: 02/05/2019. Results were: normal Last colonoscopy: 2019.  Last Dexa: 02/13/2017. Results were: t-score -3.5 at spine  Past medical history, past surgical history, family history and social history were all reviewed and documented in the EPIC chart. 2 sons. Pediatrician.   ROS:  A ROS was performed and pertinent positives and negatives are included.  Exam:  Vitals:   01/14/20 1105  BP: 118/78  Weight: 126 lb (57.2 kg)  Height: 5\' 2"  (1.575 m)   Body mass index is 23.05 kg/m.  General appearance:  Normal Thyroid:  Symmetrical, normal in size, without palpable masses or nodularity. Respiratory  Auscultation:  Clear without wheezing or rhonchi Cardiovascular  Auscultation:  Regular rate, without rubs, murmurs or gallops  Edema/varicosities:  Not grossly evident Abdominal  Soft,nontender, without masses, guarding or rebound.  Liver/spleen:  No organomegaly noted  Hernia:  None appreciated  Skin  Inspection:  Grossly normal   Breasts: Examined lying and sitting.   Right: Without masses, retractions, discharge or axillary adenopathy.   Left: Without masses, retractions, discharge or axillary adenopathy. Gentitourinary   Inguinal/mons:  Normal without inguinal adenopathy  External genitalia:  Normal  BUS/Urethra/Skene's glands:  Normal  Vagina:  Atrophic changes,  1+ rectocele  Cervix:  Normal  Uterus:  Normal in size, shape and contour.  Midline and mobile  Adnexa/parametria:     Rt: Without masses or tenderness.   Lt: Without masses or tenderness.  Anus and perineum: Non-bleeding hemorrhoids  Digital rectal exam: Normal sphincter tone without palpated masses or tenderness  Assessment/Plan:  54 y.o. G3P3 for annual exam.   Well female exam with routine gynecological exam - Education provided on SBEs, importance of preventative screenings, current guidelines, high calcium diet, regular exercise, and multivitamin daily. Labs with PCP.   Age-related osteoporosis without current pathological fracture - Plan: DG Bone Density. Stopped Fosamax 2 weeks ago due to pain. She began having pain in her hands 1 year ago but since then has developed daily generalized bone pain. Pain improved 1 week after discontinuing. We will repeat bone density and discuss treatment options based on results.  Screening for cervical cancer - Abnormal pap greater than 30 years ago. Will repeat at 5-year interval per guidelines.   Screening for breast cancer - Normal mammogram history. Continue annual screenings. Normal breast exam today.   Screening for colon cancer - colonoscopy in 2019. Will repeat at GI's recommended interval.   Follow up in 1 year for annual.      2020 [facility], 11:14 AM 01/14/2020

## 2020-01-21 DIAGNOSIS — H35373 Puckering of macula, bilateral: Secondary | ICD-10-CM | POA: Diagnosis not present

## 2020-01-21 DIAGNOSIS — H2513 Age-related nuclear cataract, bilateral: Secondary | ICD-10-CM | POA: Diagnosis not present

## 2020-01-21 DIAGNOSIS — H25013 Cortical age-related cataract, bilateral: Secondary | ICD-10-CM | POA: Diagnosis not present

## 2020-01-21 DIAGNOSIS — H401230 Low-tension glaucoma, bilateral, stage unspecified: Secondary | ICD-10-CM | POA: Diagnosis not present

## 2020-02-04 DIAGNOSIS — Z23 Encounter for immunization: Secondary | ICD-10-CM | POA: Diagnosis not present

## 2020-02-11 ENCOUNTER — Ambulatory Visit
Admission: RE | Admit: 2020-02-11 | Discharge: 2020-02-11 | Disposition: A | Discharge status: Home / Self Care | Payer: BC Managed Care – PPO | Source: Ambulatory Visit | Attending: Obstetrics and Gynecology | Admitting: Obstetrics and Gynecology

## 2020-02-11 ENCOUNTER — Other Ambulatory Visit: Payer: Self-pay

## 2020-02-11 DIAGNOSIS — Z1231 Encounter for screening mammogram for malignant neoplasm of breast: Secondary | ICD-10-CM

## 2020-03-03 DIAGNOSIS — D1801 Hemangioma of skin and subcutaneous tissue: Secondary | ICD-10-CM | POA: Diagnosis not present

## 2020-03-03 DIAGNOSIS — D229 Melanocytic nevi, unspecified: Secondary | ICD-10-CM | POA: Diagnosis not present

## 2020-03-03 DIAGNOSIS — I8393 Asymptomatic varicose veins of bilateral lower extremities: Secondary | ICD-10-CM | POA: Diagnosis not present

## 2020-03-03 DIAGNOSIS — L814 Other melanin hyperpigmentation: Secondary | ICD-10-CM | POA: Diagnosis not present

## 2020-03-16 ENCOUNTER — Ambulatory Visit (INDEPENDENT_AMBULATORY_CARE_PROVIDER_SITE_OTHER): Payer: BC Managed Care – PPO

## 2020-03-16 ENCOUNTER — Other Ambulatory Visit: Payer: Self-pay | Admitting: Nurse Practitioner

## 2020-03-16 ENCOUNTER — Other Ambulatory Visit: Payer: Self-pay

## 2020-03-16 DIAGNOSIS — Z780 Asymptomatic menopausal state: Secondary | ICD-10-CM | POA: Diagnosis not present

## 2020-03-16 DIAGNOSIS — M810 Age-related osteoporosis without current pathological fracture: Secondary | ICD-10-CM | POA: Diagnosis not present

## 2020-03-22 DIAGNOSIS — H401211 Low-tension glaucoma, right eye, mild stage: Secondary | ICD-10-CM | POA: Diagnosis not present

## 2020-04-05 DIAGNOSIS — H401221 Low-tension glaucoma, left eye, mild stage: Secondary | ICD-10-CM | POA: Diagnosis not present

## 2020-04-16 ENCOUNTER — Telehealth: Payer: Self-pay | Admitting: *Deleted

## 2020-04-16 NOTE — Telephone Encounter (Signed)
Pt called would like to discuss treatments for osteoporosis. Pt states she is not able to tolerate fosamax. Pt will come in for appt to discuss with MD

## 2020-05-05 ENCOUNTER — Ambulatory Visit: Payer: BC Managed Care – PPO | Admitting: Obstetrics & Gynecology

## 2020-06-02 ENCOUNTER — Other Ambulatory Visit: Payer: Self-pay

## 2020-06-02 ENCOUNTER — Ambulatory Visit: Payer: BC Managed Care – PPO | Admitting: Obstetrics & Gynecology

## 2020-06-02 ENCOUNTER — Encounter: Payer: Self-pay | Admitting: Obstetrics & Gynecology

## 2020-06-02 VITALS — BP 112/70

## 2020-06-02 DIAGNOSIS — M810 Age-related osteoporosis without current pathological fracture: Secondary | ICD-10-CM

## 2020-06-02 NOTE — Progress Notes (Signed)
    Dawn Macdonald 07-22-1965 [id number]        55 y.o.  G3P3L2   RP: Counseling on management of Osteoporosis  HPI: Had to stop Fosamax because of side effects, developed generalized bone pains.  The bone pains improved and resolved within 2 weeks of stopping Fosamax.  BD 03/2020 Osteoporosis at [facility].  No current fracture.   OB History  Gravida Para Term Preterm AB Living  3 3       2   SAB IAB Ectopic Multiple Live Births               # Outcome Date GA Lbr Len/2nd Weight Sex Delivery Anes PTL Lv  3 Para      Vag-Spont     2 Para      Vag-Spont     1 Para      Vag-Spont       Past medical history,surgical history, problem list, medications, allergies, family history and social history were all reviewed and documented in the EPIC chart.   Directed ROS with pertinent positives and negatives documented in the history of present illness/assessment and plan.  Exam:  Vitals:   06/02/20 1632  BP: 112/70   General appearance:  Normal  Bone Density 03/16/2020:  Osteoporosis at the Lumbar Spine T-Score -2.8.  Significant increase at the Lumbar Spine by 10.8%  Ca++ 11/12/2019 normal at 9.0   Assessment/Plan:  55 y.o. G3P3   1. Osteoporosis of lumbar spine Osteoporosis at [facility] with T-Score of -2.8.  Side effects from Fosamax, generalized bone pains.  Counseling on Prolia done.  Risks/Benefits/usage reviewed thoroughly with patient.  Ca++ normal at 9.0 in 10/2019.  Will start on Prolia.  12/2019 MD, 4:58 PM 06/02/2020

## 2020-06-03 ENCOUNTER — Telehealth: Payer: Self-pay | Admitting: *Deleted

## 2020-06-03 NOTE — Telephone Encounter (Signed)
-----   Message from Genia Del, MD sent at 06/02/2020  5:08 PM EDT ----- Regarding: Prolia Osteoporosis.  Severe side effect to Fosamax with generalized bone pains.  Start on Prolia.  Ca++ 9.0 normal in 10/2019 [facility] Everywhere).

## 2020-06-03 NOTE — Telephone Encounter (Signed)
Prolia insurance verification has been sent awaiting Summary of benefits  

## 2020-06-05 ENCOUNTER — Encounter: Payer: Self-pay | Admitting: Obstetrics & Gynecology

## 2020-06-08 NOTE — Telephone Encounter (Addendum)
Deductible $400 ($30met)  OOP MAX $2500 ($174met)  Annual exam 01/14/2020 ml  Calcium 9.0            Date 11/12/2019  Upcoming dental procedures   Prior Authorization needed YES reference #[id number]  Prior auth telephone # [phone]  Approved     Pt estimated Cost $601   Left message for pt to return my call     Coverage Details: Per Prolia summary of benefits 50% one dose. When I called [employer] they stated that it will covered at 85% . $40 admin fee

## 2020-06-09 DIAGNOSIS — F331 Major depressive disorder, recurrent, moderate: Secondary | ICD-10-CM | POA: Diagnosis not present

## 2020-06-15 NOTE — Telephone Encounter (Signed)
Pt will call me back when she gets the Prolia card Courtesy call 06/30/2020 to follow up

## 2020-06-16 DIAGNOSIS — F331 Major depressive disorder, recurrent, moderate: Secondary | ICD-10-CM | POA: Diagnosis not present

## 2020-07-02 NOTE — Telephone Encounter (Signed)
Courtesy call placed. I will close encounter and await call back

## 2020-07-05 NOTE — Telephone Encounter (Signed)
Prolia Appt 07/28/2020

## 2020-07-28 ENCOUNTER — Other Ambulatory Visit: Payer: Self-pay

## 2020-07-28 ENCOUNTER — Ambulatory Visit (INDEPENDENT_AMBULATORY_CARE_PROVIDER_SITE_OTHER): Payer: BC Managed Care – PPO | Admitting: *Deleted

## 2020-07-28 DIAGNOSIS — M810 Age-related osteoporosis without current pathological fracture: Secondary | ICD-10-CM

## 2020-07-28 MED ORDER — DENOSUMAB 60 MG/ML ~~LOC~~ SOSY
60.0000 mg | PREFILLED_SYRINGE | Freq: Once | SUBCUTANEOUS | Status: AC
  Administered 2020-07-28: 60 mg via SUBCUTANEOUS

## 2020-08-04 DIAGNOSIS — E7800 Pure hypercholesterolemia, unspecified: Secondary | ICD-10-CM | POA: Diagnosis not present

## 2020-08-11 DIAGNOSIS — H401231 Low-tension glaucoma, bilateral, mild stage: Secondary | ICD-10-CM | POA: Diagnosis not present

## 2020-10-13 DIAGNOSIS — M79641 Pain in right hand: Secondary | ICD-10-CM | POA: Diagnosis not present

## 2020-10-13 DIAGNOSIS — M25542 Pain in joints of left hand: Secondary | ICD-10-CM | POA: Diagnosis not present

## 2020-10-13 DIAGNOSIS — M79671 Pain in right foot: Secondary | ICD-10-CM | POA: Diagnosis not present

## 2020-10-13 DIAGNOSIS — M21612 Bunion of left foot: Secondary | ICD-10-CM | POA: Diagnosis not present

## 2020-10-13 DIAGNOSIS — M79672 Pain in left foot: Secondary | ICD-10-CM | POA: Diagnosis not present

## 2020-10-13 DIAGNOSIS — M79642 Pain in left hand: Secondary | ICD-10-CM | POA: Diagnosis not present

## 2020-10-13 DIAGNOSIS — M25541 Pain in joints of right hand: Secondary | ICD-10-CM | POA: Diagnosis not present

## 2020-10-13 DIAGNOSIS — M21611 Bunion of right foot: Secondary | ICD-10-CM | POA: Diagnosis not present

## 2020-10-20 DIAGNOSIS — M79641 Pain in right hand: Secondary | ICD-10-CM | POA: Diagnosis not present

## 2020-10-20 DIAGNOSIS — M79642 Pain in left hand: Secondary | ICD-10-CM | POA: Diagnosis not present

## 2020-10-20 DIAGNOSIS — M79671 Pain in right foot: Secondary | ICD-10-CM | POA: Diagnosis not present

## 2020-10-20 DIAGNOSIS — M79672 Pain in left foot: Secondary | ICD-10-CM | POA: Diagnosis not present

## 2020-10-27 DIAGNOSIS — M84375A Stress fracture, left foot, initial encounter for fracture: Secondary | ICD-10-CM | POA: Diagnosis not present

## 2020-11-03 ENCOUNTER — Telehealth: Payer: Self-pay | Admitting: *Deleted

## 2020-11-03 NOTE — Telephone Encounter (Signed)
Will have to run benefits 1st of year. Summary of Benefits may change as far as ded

## 2020-11-03 NOTE — Telephone Encounter (Signed)
Prolia Given 07/28/2020 Next injection 01/28/2021

## 2020-11-16 ENCOUNTER — Other Ambulatory Visit: Payer: Self-pay | Admitting: Obstetrics and Gynecology

## 2020-11-16 DIAGNOSIS — Z1231 Encounter for screening mammogram for malignant neoplasm of breast: Secondary | ICD-10-CM

## 2020-12-08 DIAGNOSIS — E039 Hypothyroidism, unspecified: Secondary | ICD-10-CM | POA: Diagnosis not present

## 2020-12-08 DIAGNOSIS — Z79899 Other long term (current) drug therapy: Secondary | ICD-10-CM | POA: Diagnosis not present

## 2020-12-08 DIAGNOSIS — F419 Anxiety disorder, unspecified: Secondary | ICD-10-CM | POA: Diagnosis not present

## 2020-12-08 DIAGNOSIS — R42 Dizziness and giddiness: Secondary | ICD-10-CM | POA: Diagnosis not present

## 2020-12-08 DIAGNOSIS — M810 Age-related osteoporosis without current pathological fracture: Secondary | ICD-10-CM | POA: Diagnosis not present

## 2020-12-08 DIAGNOSIS — E559 Vitamin D deficiency, unspecified: Secondary | ICD-10-CM | POA: Diagnosis not present

## 2020-12-08 DIAGNOSIS — Z0000 Encounter for general adult medical examination without abnormal findings: Secondary | ICD-10-CM | POA: Diagnosis not present

## 2020-12-08 DIAGNOSIS — E7800 Pure hypercholesterolemia, unspecified: Secondary | ICD-10-CM | POA: Diagnosis not present

## 2020-12-08 DIAGNOSIS — Z0001 Encounter for general adult medical examination with abnormal findings: Secondary | ICD-10-CM | POA: Diagnosis not present

## 2020-12-15 DIAGNOSIS — M792 Neuralgia and neuritis, unspecified: Secondary | ICD-10-CM | POA: Diagnosis not present

## 2020-12-15 DIAGNOSIS — Z789 Other specified health status: Secondary | ICD-10-CM | POA: Diagnosis not present

## 2020-12-15 DIAGNOSIS — E7800 Pure hypercholesterolemia, unspecified: Secondary | ICD-10-CM | POA: Diagnosis not present

## 2021-01-18 NOTE — Progress Notes (Signed)
° °  Dawn Macdonald [birth year] [id number]   History:  55 y.o. G 3P3 presents for annual exam. Postmenopausal - no HRT, no bleeding. Abnormal pap greater than 30 years ago. Normal mammogram history. Osteoporosis - on Prolia. Was on Fosamax prior but developed joint pain. Tolerating Prolia well. Hypothyroidism, HLD managed by PCP.   Gynecologic History No LMP recorded. Patient is postmenopausal.   Contraception: post menopausal status Sexually active: Yes  Health maintenance Last Pap: 01/02/2018. Results were: Normal, 5-year repeat Last mammogram: 02/11/2020. Results were: Normal Last colonoscopy: 2019. Results were: Normal, 10-year recall Last Dexa: 03/16/2020. Results were: T-score -2.8 at spine (3.5 in 2019)  Past medical history, past surgical history, family history and social history were all reviewed and documented in the EPIC chart. 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   ROS:  A ROS was performed and pertinent positives and negatives are included.  Exam:  Vitals:   01/19/21 0901  BP: 116/74  Weight: 130 lb (59 kg)  Height: 5\' 2"  (1.575 m)    Body mass index is 23.78 kg/m.  General appearance:  Normal Thyroid:  Symmetrical, normal in size, without palpable masses or nodularity. Respiratory  Auscultation:  Clear without wheezing or rhonchi Cardiovascular  Auscultation:  Regular rate, without rubs, murmurs or gallops  Edema/varicosities:  Not grossly evident Abdominal  Soft,nontender, without masses, guarding or rebound.  Liver/spleen:  No organomegaly noted  Hernia:  None appreciated  Skin  Inspection:  Grossly normal   Breasts: Examined lying and sitting.   Right: Without masses, retractions, discharge or axillary adenopathy.   Left: Without masses, retractions, discharge or axillary adenopathy. Gentitourinary   Inguinal/mons:  Normal without inguinal adenopathy  External genitalia:  Normal  BUS/Urethra/Skene's glands:  Normal  Vagina:   Atrophic changes, 1+ rectocele  Cervix:  Normal  Uterus:  Normal in size, shape and contour.  Midline and mobile  Adnexa/parametria:     Rt: Without masses or tenderness.   Lt: Without masses or tenderness.  Anus and perineum: Non-bleeding hemorrhoids  Digital rectal exam: Normal sphincter tone without palpated masses or tenderness  Patient informed chaperone available to be present for breast and pelvic exam. Patient has requested no chaperone to be present. Patient has been advised what will be completed during breast and pelvic exam.   Assessment/Plan:  55 y.o. G3P3 for annual exam.   Well female exam with routine gynecological exam - Education provided on SBEs, importance of preventative screenings, current guidelines, high calcium diet, regular exercise, and multivitamin daily. Labs with PCP.   Postmenopausal - no HRT, no bleeding.   Age-related osteoporosis without current pathological fracture - T-score -2.8 in spine February 2022 (3.5 in 2019). Previously on Fosamax but developed generalized joint pain. Switched to Prolia 05/2020 and tolerating well. Recommend repeating DXA in May after receiving Prolia x 1 year. Continue Vitamin D + Calcium and regular exercise.   Screening for cervical cancer - Abnormal pap greater than 30 years ago. Will repeat at 5-year interval per guidelines.   Screening for breast cancer - Normal mammogram history. Continue annual screenings. Scheduled next month.Normal breast exam today.   Screening for colon cancer - Colonoscopy in 2019. Will repeat at 10-year interval per GI's recommendation.   Follow up in 1 year for annual.      2020 [facility], 9:22 AM 01/19/2021

## 2021-01-19 ENCOUNTER — Encounter: Payer: Self-pay | Admitting: Nurse Practitioner

## 2021-01-19 ENCOUNTER — Other Ambulatory Visit: Payer: Self-pay

## 2021-01-19 ENCOUNTER — Ambulatory Visit (INDEPENDENT_AMBULATORY_CARE_PROVIDER_SITE_OTHER): Payer: BC Managed Care – PPO | Admitting: Nurse Practitioner

## 2021-01-19 VITALS — BP 116/74 | Ht 62.0 in | Wt 130.0 lb

## 2021-01-19 DIAGNOSIS — Z780 Asymptomatic menopausal state: Secondary | ICD-10-CM

## 2021-01-19 DIAGNOSIS — Z01419 Encounter for gynecological examination (general) (routine) without abnormal findings: Secondary | ICD-10-CM | POA: Diagnosis not present

## 2021-01-19 DIAGNOSIS — M810 Age-related osteoporosis without current pathological fracture: Secondary | ICD-10-CM

## 2021-02-02 DIAGNOSIS — H25013 Cortical age-related cataract, bilateral: Secondary | ICD-10-CM | POA: Diagnosis not present

## 2021-02-02 DIAGNOSIS — H401231 Low-tension glaucoma, bilateral, mild stage: Secondary | ICD-10-CM | POA: Diagnosis not present

## 2021-02-02 DIAGNOSIS — H2513 Age-related nuclear cataract, bilateral: Secondary | ICD-10-CM | POA: Diagnosis not present

## 2021-02-02 DIAGNOSIS — H35373 Puckering of macula, bilateral: Secondary | ICD-10-CM | POA: Diagnosis not present

## 2021-02-16 ENCOUNTER — Ambulatory Visit
Admission: RE | Admit: 2021-02-16 | Discharge: 2021-02-16 | Disposition: A | Discharge status: Home / Self Care | Payer: BC Managed Care – PPO | Source: Ambulatory Visit | Attending: Obstetrics and Gynecology | Admitting: Obstetrics and Gynecology

## 2021-02-16 DIAGNOSIS — Z1231 Encounter for screening mammogram for malignant neoplasm of breast: Secondary | ICD-10-CM

## 2021-03-02 DIAGNOSIS — L814 Other melanin hyperpigmentation: Secondary | ICD-10-CM | POA: Diagnosis not present

## 2021-03-02 DIAGNOSIS — D1801 Hemangioma of skin and subcutaneous tissue: Secondary | ICD-10-CM | POA: Diagnosis not present

## 2021-03-02 DIAGNOSIS — L821 Other seborrheic keratosis: Secondary | ICD-10-CM | POA: Diagnosis not present

## 2021-03-02 DIAGNOSIS — L298 Other pruritus: Secondary | ICD-10-CM | POA: Diagnosis not present

## 2021-03-09 ENCOUNTER — Ambulatory Visit: Payer: BC Managed Care – PPO

## 2021-03-16 ENCOUNTER — Ambulatory Visit
Admission: RE | Admit: 2021-03-16 | Discharge: 2021-03-16 | Disposition: A | Discharge status: Home / Self Care | Payer: BC Managed Care – PPO | Source: Ambulatory Visit | Attending: Obstetrics and Gynecology | Admitting: Obstetrics and Gynecology

## 2021-03-16 ENCOUNTER — Other Ambulatory Visit: Payer: Self-pay

## 2021-03-16 DIAGNOSIS — Z1231 Encounter for screening mammogram for malignant neoplasm of breast: Secondary | ICD-10-CM | POA: Diagnosis not present

## 2021-03-31 ENCOUNTER — Other Ambulatory Visit: Payer: Self-pay | Admitting: *Deleted

## 2021-03-31 MED ORDER — ALENDRONATE SODIUM 70 MG PO TABS: 70.0000 mg | ORAL_TABLET | ORAL | 1 refills | Status: DC

## 2021-03-31 NOTE — Telephone Encounter (Signed)
Detailed message left per DPR advising patient Fosamax sent to [facility]. Advised patient to return call to office with any questions/concerns.  ? ?Encounter closed.  ?

## 2021-03-31 NOTE — Telephone Encounter (Signed)
Call to patient. Summary of benefits reviewed with patient and she declines to proceed with Prolia. Patient requesting to go back on the Fosamax she was taking before. States she had been prescribed cholesterol medication by her PCP at the same time as the Fosamax and her side effects/symptoms were coming from the cholesterol medication. States she had a "heart scan" done and is no longer on cholesterol medication. Patient requests prescription for Fosamax be sent to [facility].  ? ?Medication pended for #9, 0RF. Routing to provider to review and approve refill of Fosamax.  ? ?

## 2021-03-31 NOTE — Telephone Encounter (Signed)
Deductible: $400 ($0 met) ? ?OOP MAX: $2500 ($18 met)  ? ?Annual exam: 01-19-21 TW ? ?Calcium: 9.7            Date: 12-08-20 ? ?Upcoming dental procedures: No  ? ?Prior Authorization needed: yes, on file  ? ?Pt estimated Cost: ~$601 ? ? ? ?Coverage Details: For the primary MD option, Prolia and administration will be subject to a $400 deductible and 15% coinsurance up to $2500 OOP max.  ? ? ?

## 2021-04-21 DIAGNOSIS — R1310 Dysphagia, unspecified: Secondary | ICD-10-CM | POA: Diagnosis not present

## 2021-04-21 DIAGNOSIS — R1013 Epigastric pain: Secondary | ICD-10-CM | POA: Diagnosis not present

## 2021-04-21 DIAGNOSIS — E039 Hypothyroidism, unspecified: Secondary | ICD-10-CM | POA: Diagnosis not present

## 2021-04-21 DIAGNOSIS — K625 Hemorrhage of anus and rectum: Secondary | ICD-10-CM | POA: Diagnosis not present

## 2021-05-11 DIAGNOSIS — H401230 Low-tension glaucoma, bilateral, stage unspecified: Secondary | ICD-10-CM | POA: Diagnosis not present

## 2021-05-18 DIAGNOSIS — K219 Gastro-esophageal reflux disease without esophagitis: Secondary | ICD-10-CM | POA: Diagnosis not present

## 2021-05-18 DIAGNOSIS — K2970 Gastritis, unspecified, without bleeding: Secondary | ICD-10-CM | POA: Diagnosis not present

## 2021-05-18 DIAGNOSIS — R1310 Dysphagia, unspecified: Secondary | ICD-10-CM | POA: Diagnosis not present

## 2021-05-18 DIAGNOSIS — K2100 Gastro-esophageal reflux disease with esophagitis, without bleeding: Secondary | ICD-10-CM | POA: Diagnosis not present

## 2021-05-18 DIAGNOSIS — R110 Nausea: Secondary | ICD-10-CM | POA: Diagnosis not present

## 2021-05-18 DIAGNOSIS — R1013 Epigastric pain: Secondary | ICD-10-CM | POA: Diagnosis not present

## 2021-06-15 DIAGNOSIS — M545 Low back pain, unspecified: Secondary | ICD-10-CM | POA: Diagnosis not present

## 2021-06-22 DIAGNOSIS — M545 Low back pain, unspecified: Secondary | ICD-10-CM | POA: Diagnosis not present

## 2021-06-29 DIAGNOSIS — M5416 Radiculopathy, lumbar region: Secondary | ICD-10-CM | POA: Diagnosis not present

## 2021-07-13 DIAGNOSIS — M5416 Radiculopathy, lumbar region: Secondary | ICD-10-CM | POA: Diagnosis not present

## 2021-07-14 IMAGING — MG DIGITAL SCREENING BILAT W/ TOMO W/ CAD
8 series · 9 of 24 positions shown · non-contrast
Comparison: Previous exam(s).

CLINICAL DATA: Screening.

EXAM:
DIGITAL SCREENING BILATERAL MAMMOGRAM WITH TOMO AND CAD

[L MLO synth-2D]
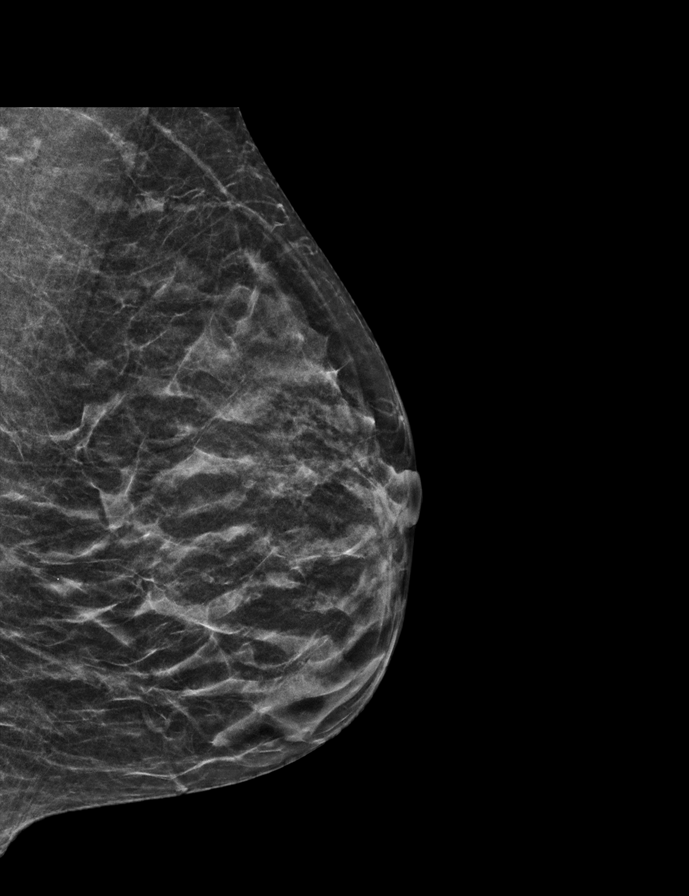

[R MLO synth-2D]
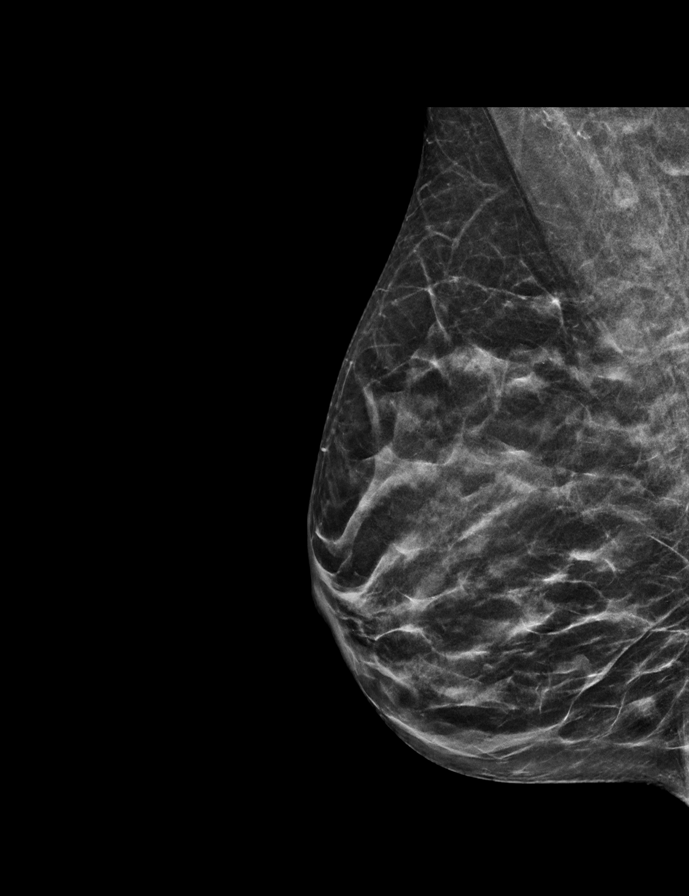

[L CC synth-2D]
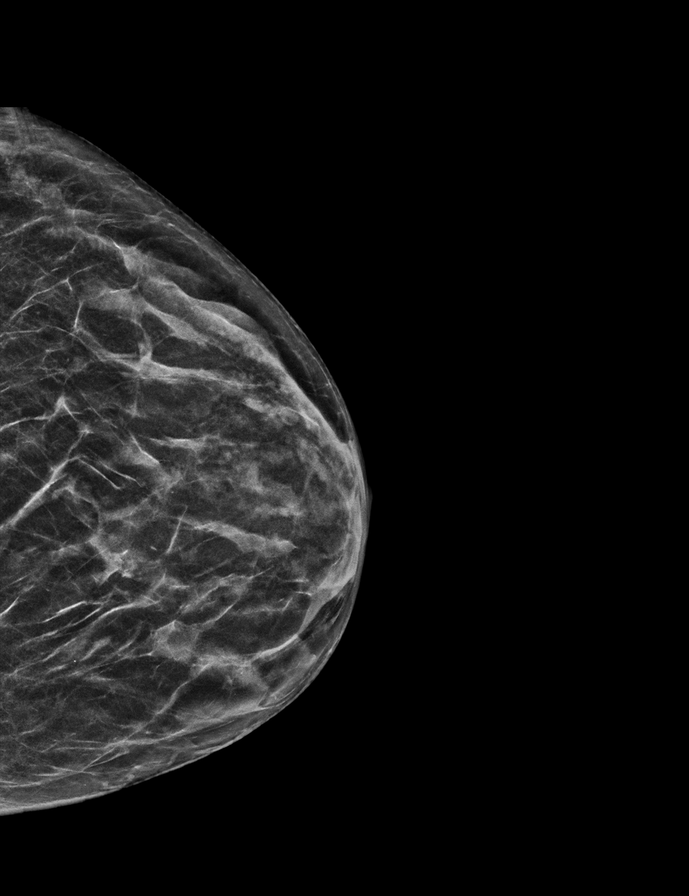

[R CC synth-2D]
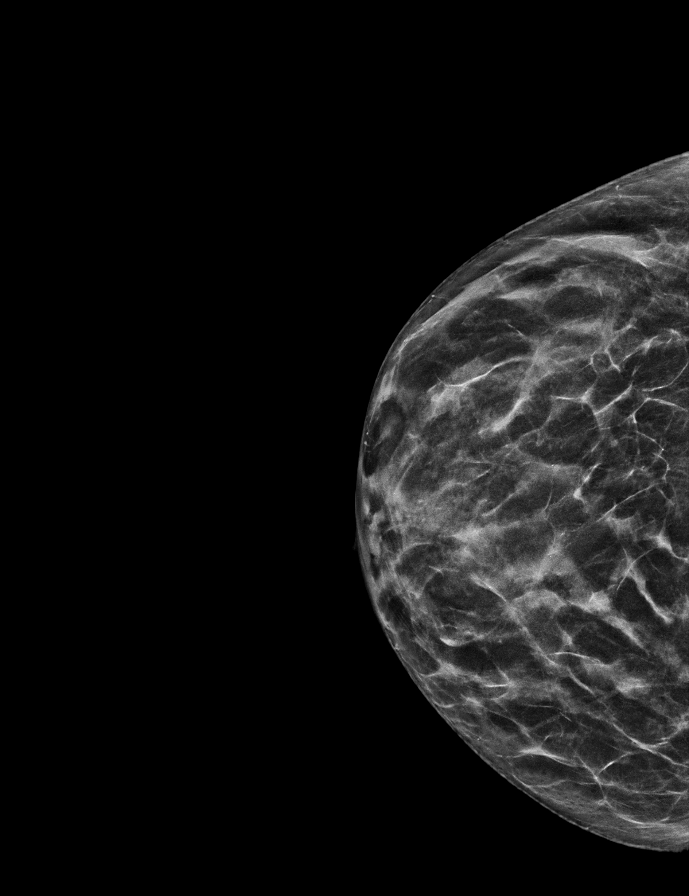

[R CC tomo · 2 of 46 frames shown]
[frame 15/46]
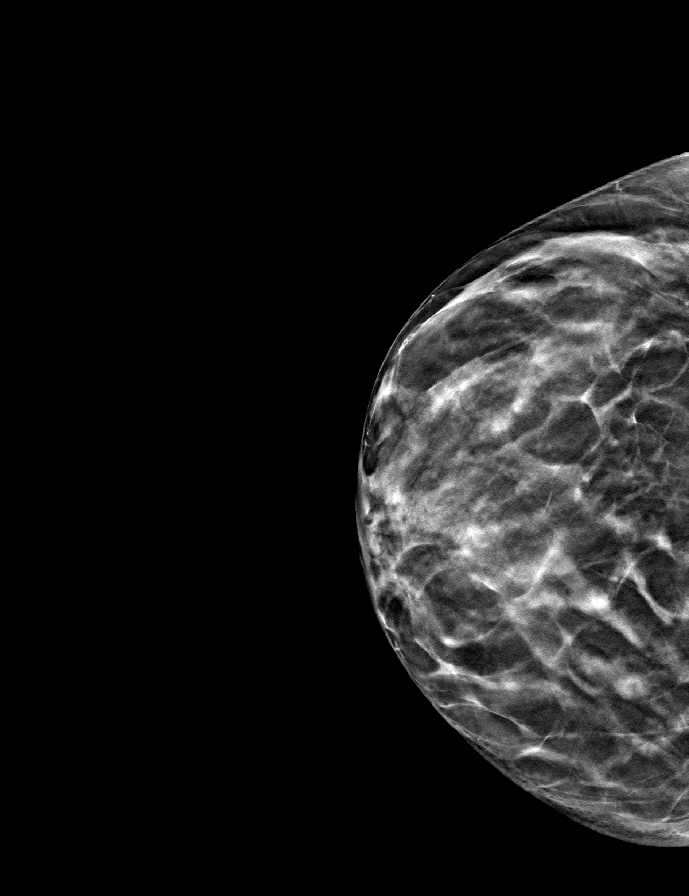
[frame 23/46]
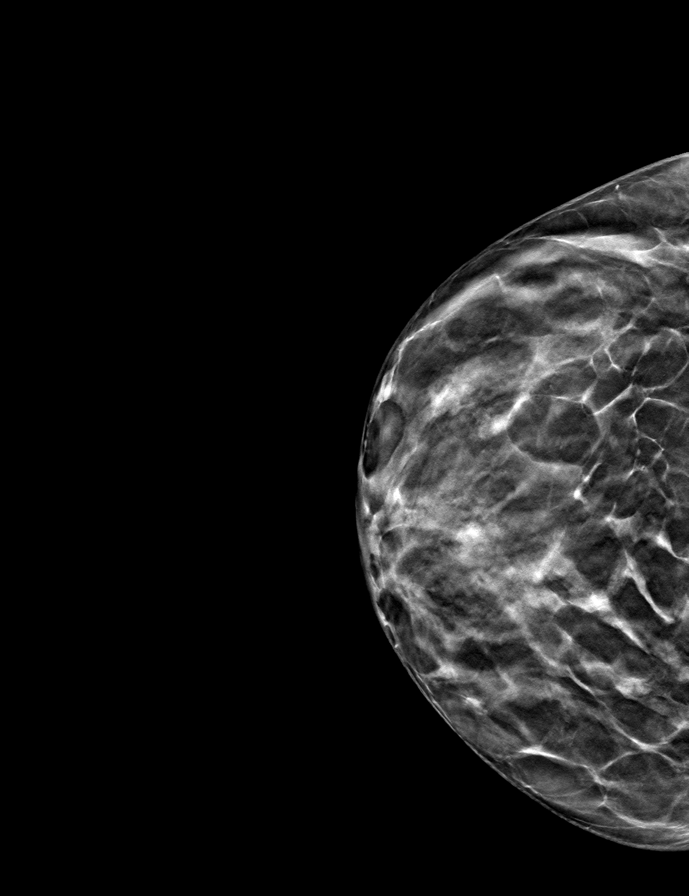

[L MLO tomo · tomo slice 20/39.0]
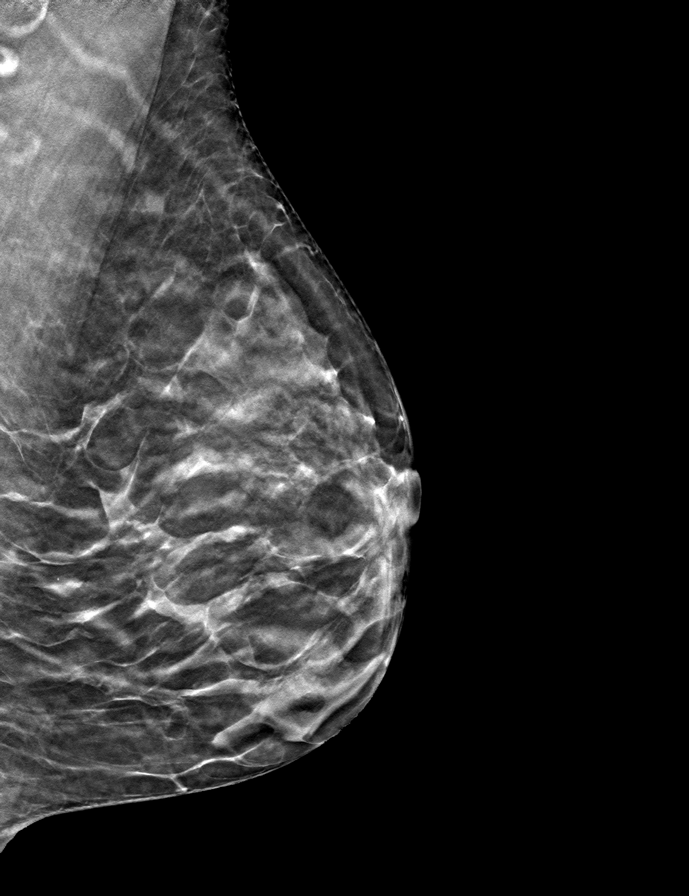

[L CC tomo · tomo slice 23/44.0]
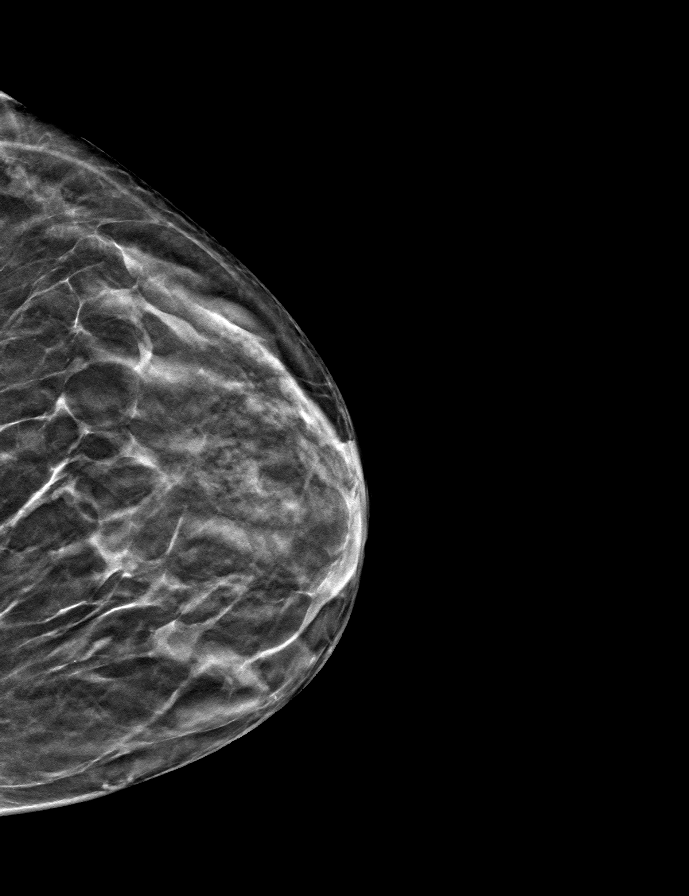

[R MLO tomo · tomo slice 25/48.0]
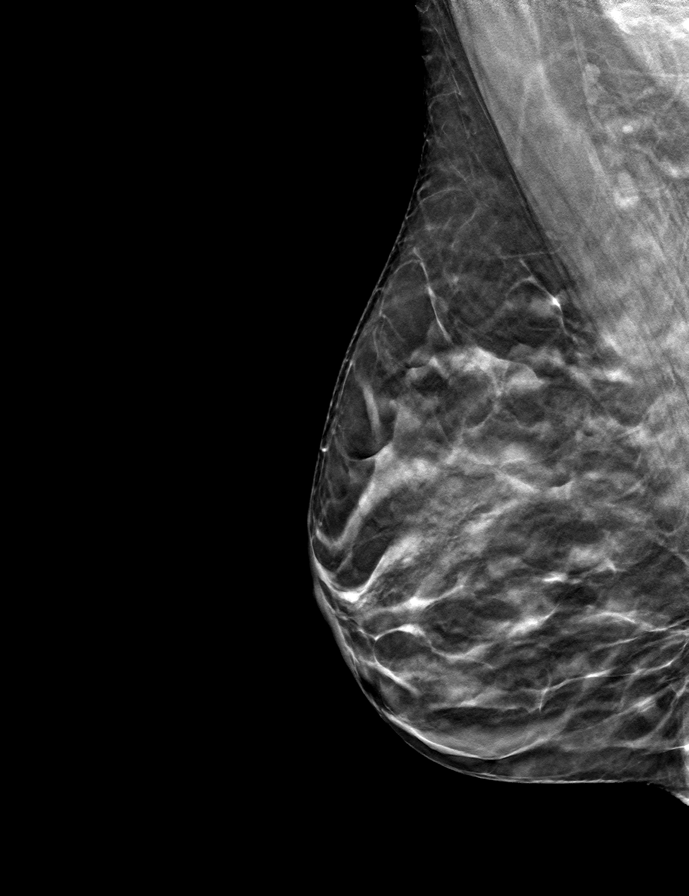

[9 of 24 positions shown; findings below may reference images not displayed]

ACR Breast Density Category c: The breast tissue is heterogeneously
dense, which may obscure small masses.
FINDINGS: There are no findings suspicious for malignancy. Images were
processed with CAD.
IMPRESSION: No mammographic evidence of malignancy. A result letter of this
screening mammogram will be mailed directly to the patient.

RECOMMENDATION:
Screening mammogram in one year. (Code:[id number])

BI-RADS CATEGORY  1: Negative.

## 2021-08-11 DIAGNOSIS — M5416 Radiculopathy, lumbar region: Secondary | ICD-10-CM | POA: Diagnosis not present

## 2021-08-24 DIAGNOSIS — H401231 Low-tension glaucoma, bilateral, mild stage: Secondary | ICD-10-CM | POA: Diagnosis not present

## 2021-09-15 ENCOUNTER — Other Ambulatory Visit: Payer: Self-pay | Admitting: Nurse Practitioner

## 2021-09-15 NOTE — Telephone Encounter (Signed)
TW pt. Last AEX 01/19/21--scheduled 01/25/22 Last DEXA 03/16/20

## 2021-09-21 ENCOUNTER — Other Ambulatory Visit: Payer: Self-pay

## 2021-09-21 ENCOUNTER — Telehealth: Payer: Self-pay

## 2021-09-21 ENCOUNTER — Encounter: Payer: Self-pay | Admitting: Nurse Practitioner

## 2021-09-21 ENCOUNTER — Ambulatory Visit: Payer: BC Managed Care – PPO | Admitting: Nurse Practitioner

## 2021-09-21 VITALS — BP 104/70 | HR 80

## 2021-09-21 DIAGNOSIS — N812 Incomplete uterovaginal prolapse: Secondary | ICD-10-CM

## 2021-09-21 DIAGNOSIS — N816 Rectocele: Secondary | ICD-10-CM

## 2021-09-21 DIAGNOSIS — N814 Uterovaginal prolapse, unspecified: Secondary | ICD-10-CM

## 2021-09-21 DIAGNOSIS — M5416 Radiculopathy, lumbar region: Secondary | ICD-10-CM | POA: Diagnosis not present

## 2021-09-21 NOTE — Progress Notes (Signed)
   Acute Office Visit  Subjective:    Patient ID: Dawn Macdonald, female    DOB: Nov 23, 1965, 56 y.o.   MRN: [id number]   HPI 56 y.o. G3P3 presents today for what she thinks is a prolapse. She first noticed it a couple of weeks ago when she felt a bulge/pressure at vaginal opening. When she looked with mirror she could see something at opening. She has some mild lower back pain/aching. Sometimes she feels she does not fully empty her bladder when voiding. No urgency or incontinence. H/O asymptomatic rectocele.   Review of Systems  Constitutional: Negative.   Genitourinary:  Positive for difficulty urinating (Feels she does not fully empty) and pelvic pain (Aching). Negative for dyspareunia, dysuria, flank pain, frequency, urgency, vaginal bleeding, vaginal discharge and vaginal pain.  Musculoskeletal:  Positive for back pain (Lower).       Objective:    Physical Exam Constitutional:      Appearance: Normal appearance.  Genitourinary:    General: Normal vulva.     Vagina: No vaginal discharge or erythema.     Cervix: Normal.     Uterus: With uterine prolapse (Grade 2-3). Not tender.      Comments: Rectocele present    BP 104/70   Pulse 80   SpO2 97%  Wt Readings from Last 3 Encounters:  01/19/21 130 lb (59 kg)  01/14/20 126 lb (57.2 kg)  01/08/19 129 lb (58.5 kg)        Patient informed chaperone available to be present for breast and/or pelvic exam. Patient has requested no chaperone to be present. Patient has been advised what will be completed during breast and pelvic exam.   Assessment & Plan:   Problem List Items Addressed This Visit   None Visit Diagnoses     Second degree uterine prolapse    -  Primary   Rectocele          Plan: We discussed causes of pelvic organ prolapse and management options to include lifestyle changes, pelvic floor PT, pessary, and surgery. Pelvic floor PT referral. Double void to help with emptying bladder, avoid bearing down with  bowel movements and exercise. If no improvement Urogyn recommended and she is agreeable.      Olivia Mackie DNP, 3:05 PM 09/21/2021

## 2021-09-21 NOTE — Telephone Encounter (Signed)
Pelvic floor pT Received: Today Olivia Mackie, NP  P [facility] Triage Please send referral for pelvic floor PT for uterine prolapse and rectocele. Thanks.

## 2021-09-21 NOTE — Telephone Encounter (Signed)
Referral placed in Epic for PT at [facility].

## 2021-09-27 NOTE — Telephone Encounter (Signed)
Appointment scheduled 10/05/21.

## 2021-10-05 ENCOUNTER — Ambulatory Visit: Payer: BC Managed Care – PPO | Attending: Nurse Practitioner | Admitting: Physical Therapy

## 2021-10-05 ENCOUNTER — Encounter: Payer: Self-pay | Admitting: Physical Therapy

## 2021-10-05 ENCOUNTER — Other Ambulatory Visit: Payer: Self-pay

## 2021-10-05 DIAGNOSIS — N816 Rectocele: Secondary | ICD-10-CM | POA: Insufficient documentation

## 2021-10-05 DIAGNOSIS — N814 Uterovaginal prolapse, unspecified: Secondary | ICD-10-CM | POA: Diagnosis not present

## 2021-10-05 DIAGNOSIS — R293 Abnormal posture: Secondary | ICD-10-CM

## 2021-10-05 DIAGNOSIS — R279 Unspecified lack of coordination: Secondary | ICD-10-CM

## 2021-10-05 DIAGNOSIS — M6281 Muscle weakness (generalized): Secondary | ICD-10-CM

## 2021-10-05 NOTE — Therapy (Signed)
OUTPATIENT PHYSICAL THERAPY FEMALE PELVIC EVALUATION   Patient Name: Dawn Macdonald MRN: [id number] DOB:03-16-1965, 56 y.o., female Today's Date: 10/05/2021   PT End of Session - 10/05/21 1627     Visit Number 1    Date for PT Re-Evaluation 12/28/21    Authorization Type bcbs    PT Start Time 1623    PT Stop Time 1700    PT Time Calculation (min) 37 min    Activity Tolerance Patient tolerated treatment well    Behavior During Therapy [facility] for tasks assessed/performed             Past Medical History:  Diagnosis Date   Depression    Hypothyroid    Sciatica    Varicose veins    Past Surgical History:  Procedure Laterality Date   implant tooth     TONSILLECTOMY     VARICOSE VEIN SURGERY  09/1997   Patient Active Problem List   Diagnosis Date Noted   Familial hypercholesteremia 01/08/2019   Hypothyroid    Varicose veins    Osteoporosis     PCP: Richmond Campbell., PA-C  REFERRING PROVIDER: Olivia Mackie, NP  REFERRING DIAG:  N81.4 (ICD-10-CM) - Uterine prolapse  N81.6 (ICD-10-CM) - Rectocele    THERAPY DIAG:  No diagnosis found.  Rationale for Evaluation and Treatment Rehabilitation  ONSET DATE: insidious  SUBJECTIVE:                                                                                                                                                                                           SUBJECTIVE STATEMENT: I am trying PT to see if this can help.  The prolapse is always there and uncomfortable Fluid intake: Yes:       PAIN:  Are you having pain? No  PRECAUTIONS: None  WEIGHT BEARING RESTRICTIONS No  FALLS:  Has patient fallen in last 6 months? No  LIVING ENVIRONMENT: Lives with: lives with their family and lives with their spouse Lives in: House/apartment   OCCUPATION: pediatrician   PLOF: Independent  PATIENT GOALS reduce prolapse  PERTINENT HISTORY:  3 vaginal deliveries Sexual abuse: No  BOWEL  MOVEMENT Pain with bowel movement: No Type of bowel movement: normal Fully empty rectum: Yes:     URINATION Pain with urination: No Fully empty bladder: No - takes longer and need to push Stream: Weak Urgency: No Frequency: normal Leakage:  no Pads: No  INTERCOURSE Pain with intercourse: No   PREGNANCY Vaginal deliveries 3 Tearing Yes: first one   PROLAPSE None - uterine    OBJECTIVE:   DIAGNOSTIC FINDINGS:    PATIENT SURVEYS:  PFIQ-7   COGNITION:  Overall cognitive status: Within functional limits for tasks assessed     SENSATION:    MUSCLE LENGTH: Hamstrings: Right 70 deg; Left 90 deg Thomas test: Right  deg; Left  deg  LUMBAR SPECIAL TESTS:  SLR neg  FUNCTIONAL TESTS:  Single leg stand hip ER when standing on the Rt  GAIT: Comments: WFL                POSTURE: anterior pelvic tilt and low arches and weight to great toe bil bunions, hip IR, knees locked out   PELVIC ALIGNMENT: normal  LUMBARAROM/PROM  A/PROM A/PROM  eval  Flexion WFL   Extension   Right lateral flexion   Left lateral flexion   Right rotation   Left rotation    (Blank rows = not tested)  LOWER EXTREMITY ROM:              Passive ROM Right eval Left eval  Hip flexion    Hip extension    Hip abduction    Hip adduction    Hip internal rotation 75% 75%  Hip external rotation 75% 75%  Knee flexion    Knee extension    Ankle dorsiflexion    Ankle plantarflexion    Ankle inversion    Ankle eversion     (Blank rows = not tested)  LOWER EXTREMITY MMT:  MMT Right eval Left eval  Hip flexion 5/5 5/5  Hip extension 4/5 4/5  Hip abduction 4/5 5/5  Hip adduction 4/5 4+/5  Hip internal rotation 5/5 5/5  Hip external rotation 5/5 5/5  Knee flexion    Knee extension    Ankle dorsiflexion    Ankle plantarflexion    Ankle inversion    Ankle eversion      PALPATION:   General  lumbar and thoracic paraspinals tight                External Perineal Exam  descended/gaping perineal body; redness and prolapse around urethra meatus; posterior and anterior vaginal wall weakness                             Internal Pelvic Floor levators weakly palpable at pubic rami; posterior pelvic floor tight  Patient confirms identification and approves PT to assess internal pelvic floor and treatment Yes  PELVIC MMT:   MMT eval  Vaginal 2/5 x 4 reps; 6 sec hold max  Internal Anal Sphincter   External Anal Sphincter   Puborectalis   Diastasis Recti   (Blank rows = not tested)        TONE: low  PROLAPSE: With valsalva prolapsing to slightly beyond vaginal opening  TODAY'S TREATMENT  EVAL       HOME EXERCISE PROGRAM: NA  ASSESSMENT:  CLINICAL IMPRESSION: Patient is a friendly 56 y.o. female who was seen today for physical therapy evaluation and treatment for pelvic organ prolapse.  She has pelvic floor MMT 2/5 for 4 reps, and holding for max of 6 sec. Posture demonstrates core, hip and quad weakness. Further assessment of the bil hips shows some tension with rotation and weakness as noted above.  Pt will benefit from skilled PT to address all impairments in order to restore function without prolapse in order to manage symptoms without surgery.   OBJECTIVE IMPAIRMENTS decreased coordination, decreased endurance, decreased ROM, decreased strength, impaired flexibility, impaired tone, postural dysfunction, and pain.   ACTIVITY LIMITATIONS lifting, bending,  and standing  PARTICIPATION LIMITATIONS: cleaning and community activity  PERSONAL FACTORS 1-2 comorbidities: history of 3 vaginal deliveries  are also affecting patient's functional outcome.   REHAB POTENTIAL: Good  CLINICAL DECISION MAKING: Stable/uncomplicated  EVALUATION COMPLEXITY: Low   GOALS: Goals reviewed with patient? Yes   LONG TERM GOALS: Target date: 12/29/2021   Pt will be independent with advanced HEP to maintain improvements made throughout therapy  Baseline:   Goal status: INITIAL  2.  Pt will demonstrate improved posture due to transversus abdominus activation  and 5/5 bil hip strength Baseline:  Goal status: INITIAL  3.  Pt will report 50% less symptoms from prolapse Baseline:  Goal status: INITIAL  4.  Pt will be able to void without pushing Baseline:  Goal status: INITIAL  5.  Pt will be able to sustain kegel for 20 seconds for endurance needed during normal household activities Baseline:  Goal status: INITIAL  PLAN: PT FREQUENCY: 1x/week  PT DURATION: 12 weeks  PLANNED INTERVENTIONS: Therapeutic exercises, Therapeutic activity, Neuromuscular re-education, Balance training, Gait training, Patient/Family education, Self Care, Joint mobilization, Dry Needling, Electrical stimulation, Cryotherapy, Moist heat, Taping, Biofeedback, and Manual therapy  PLAN FOR NEXT SESSION: posture coordinating core with pelvic floor contraction - prone and qped; arch lifting   Brayton Caves Desare Duddy, PT 10/05/2021, 4:28 PM

## 2021-10-12 ENCOUNTER — Ambulatory Visit: Payer: BC Managed Care – PPO | Admitting: Physical Therapy

## 2021-10-12 ENCOUNTER — Encounter: Payer: Self-pay | Admitting: Physical Therapy

## 2021-10-12 DIAGNOSIS — R293 Abnormal posture: Secondary | ICD-10-CM

## 2021-10-12 DIAGNOSIS — N816 Rectocele: Secondary | ICD-10-CM | POA: Diagnosis not present

## 2021-10-12 DIAGNOSIS — R279 Unspecified lack of coordination: Secondary | ICD-10-CM

## 2021-10-12 DIAGNOSIS — M6281 Muscle weakness (generalized): Secondary | ICD-10-CM

## 2021-10-12 DIAGNOSIS — N814 Uterovaginal prolapse, unspecified: Secondary | ICD-10-CM | POA: Diagnosis not present

## 2021-10-12 NOTE — Therapy (Signed)
OUTPATIENT PHYSICAL THERAPY FEMALE PELVIC TREATMENT   Patient Name: Dawn Macdonald MRN: [id number] DOB:10/04/1965, 56 y.o., female Today's Date: 10/12/2021   PT End of Session - 10/12/21 1407     Visit Number 2    Date for PT Re-Evaluation 12/28/21    Authorization Type bcbs    PT Start Time 1401    PT Stop Time 1441    PT Time Calculation (min) 40 min    Activity Tolerance Patient tolerated treatment well    Behavior During Therapy [facility] for tasks assessed/performed              Past Medical History:  Diagnosis Date   Depression    Hypothyroid    Sciatica    Varicose veins    Past Surgical History:  Procedure Laterality Date   implant tooth     TONSILLECTOMY     VARICOSE VEIN SURGERY  09/1997   Patient Active Problem List   Diagnosis Date Noted   Familial hypercholesteremia 01/08/2019   Hypothyroid    Varicose veins    Osteoporosis     PCP: Richmond Campbell., PA-C  REFERRING PROVIDER: Olivia Mackie, NP  REFERRING DIAG:  N81.4 (ICD-10-CM) - Uterine prolapse  N81.6 (ICD-10-CM) - Rectocele    THERAPY DIAG:  Muscle weakness (generalized)  Abnormal posture  Unspecified lack of coordination  Rationale for Evaluation and Treatment Rehabilitation  ONSET DATE: insidious  SUBJECTIVE:                                                                                                                                                                                           SUBJECTIVE STATEMENT: I have maybe a 2/10 discomfort today.  I have been lying on the pillows, not sure if it is helping yet. Fluid intake: Yes:       PAIN:  Are you having pain? No  PRECAUTIONS: None  WEIGHT BEARING RESTRICTIONS No  FALLS:  Has patient fallen in last 6 months? No  LIVING ENVIRONMENT: Lives with: lives with their family and lives with their spouse Lives in: House/apartment   OCCUPATION: pediatrician   PLOF: Independent  PATIENT GOALS reduce  prolapse  PERTINENT HISTORY:  3 vaginal deliveries Sexual abuse: No  BOWEL MOVEMENT Pain with bowel movement: No Type of bowel movement: normal Fully empty rectum: Yes:     URINATION Pain with urination: No Fully empty bladder: No - takes longer and need to push Stream: Weak Urgency: No Frequency: normal Leakage:  no Pads: No  INTERCOURSE Pain with intercourse: No   PREGNANCY Vaginal deliveries 3 Tearing Yes: first one   PROLAPSE None - uterine  OBJECTIVE:   DIAGNOSTIC FINDINGS:    PATIENT SURVEYS:    PFIQ-7   COGNITION:  Overall cognitive status: Within functional limits for tasks assessed     SENSATION:    MUSCLE LENGTH: Hamstrings: Right 70 deg; Left 90 deg Thomas test: Right  deg; Left  deg  LUMBAR SPECIAL TESTS:  SLR neg  FUNCTIONAL TESTS:  Single leg stand hip ER when standing on the Rt  GAIT: Comments: WFL                POSTURE: anterior pelvic tilt and low arches and weight to great toe bil bunions, hip IR, knees locked out   PELVIC ALIGNMENT: normal  LUMBARAROM/PROM  A/PROM A/PROM  eval  Flexion WFL   Extension   Right lateral flexion   Left lateral flexion   Right rotation   Left rotation    (Blank rows = not tested)  LOWER EXTREMITY ROM:              Passive ROM Right eval Left eval  Hip flexion    Hip extension    Hip abduction    Hip adduction    Hip internal rotation 75% 75%  Hip external rotation 75% 75%  Knee flexion    Knee extension    Ankle dorsiflexion    Ankle plantarflexion    Ankle inversion    Ankle eversion     (Blank rows = not tested)  LOWER EXTREMITY MMT:  MMT Right eval Left eval  Hip flexion 5/5 5/5  Hip extension 4/5 4/5  Hip abduction 4/5 5/5  Hip adduction 4/5 4+/5  Hip internal rotation 5/5 5/5  Hip external rotation 5/5 5/5  Knee flexion    Knee extension    Ankle dorsiflexion    Ankle plantarflexion    Ankle inversion    Ankle eversion      PALPATION:   General   lumbar and thoracic paraspinals tight                External Perineal Exam descended/gaping perineal body; redness and prolapse around urethra meatus; posterior and anterior vaginal wall weakness                             Internal Pelvic Floor levators weakly palpable at pubic rami; posterior pelvic floor tight  Patient confirms identification and approves PT to assess internal pelvic floor and treatment Yes  PELVIC MMT:   MMT eval  Vaginal 2/5 x 4 reps; 6 sec hold max  Internal Anal Sphincter   External Anal Sphincter   Puborectalis   Diastasis Recti   (Blank rows = not tested)        TONE: low  PROLAPSE: With valsalva prolapsing to slightly beyond vaginal opening  TODAY'S TREATMENT  Date: 10/12/21  Nuero Re-ed: Education and cues for coordination of breathing  Pelvic floor muscle contracting and relaxing at appropriate times during activities  Exercises:  Prone kegel: add SLR, knees bent and eccentric h/s - all 15-20 reps Quadruped kegel : neutral position and IR hip rotation position, pelvis elevated in rock pose - 15 reps Supine: Piriformis stretch Block hip ER and IR - 10x with exhale and kegel Marching Hip abduction core engaged Bridge - 15 Side lying: hip abduction - 20x each    PATIENT EDUCATION: Education details: Access Code: [email or web address] Person educated: Patient Education method: Explanation, Demonstration, Actor cues, Verbal cues, and Handouts Education comprehension: verbalized understanding and  returned demonstration   HOME EXERCISE PROGRAM: Access Code: [email or web address] URL: [email or web address] Date: 10/12/2021 Prepared by: Dwana Curd  Exercises - Ball squeeze with Kegel  - 1 x daily - 7 x weekly - 1-2 sets - 10 reps - 2 sec hold - Hooklying Small March  - 1 x daily - 7 x weekly - 1-2 sets - 10 reps - 2 sec hold - Supine Bridge with Pelvic Floor Contraction  - 1 x daily - 7 x weekly - 1-2 sets - 10 reps - 3 sec hold -  Sidelying Pelvic Floor Contraction with Hip Abduction  - 1 x daily - 7 x weekly - 2 sets - 10 reps - Prone Pelvic Floor Contraction on Swiss Ball  - 1 x daily - 7 x weekly - 2 sets - 10 reps  ASSESSMENT:  CLINICAL IMPRESSION: Today's treatment to establish HEP was successful.  Pt was able to do basic core and pelvic floor ex's.  Pt needed cues to activate transversus abdominus and obliques with controlling ribcage excursion. Pt will benefit from skilled PT to address all impairments in order to restore function without prolapse in order to manage symptoms without surgery.   OBJECTIVE IMPAIRMENTS decreased coordination, decreased endurance, decreased ROM, decreased strength, impaired flexibility, impaired tone, postural dysfunction, and pain.   ACTIVITY LIMITATIONS lifting, bending, and standing  PARTICIPATION LIMITATIONS: cleaning and community activity  PERSONAL FACTORS 1-2 comorbidities: history of 3 vaginal deliveries  are also affecting patient's functional outcome.   REHAB POTENTIAL: Good  CLINICAL DECISION MAKING: Stable/uncomplicated  EVALUATION COMPLEXITY: Low   GOALS: Goals reviewed with patient? Yes   LONG TERM GOALS: Target date: 12/29/2021   Pt will be independent with advanced HEP to maintain improvements made throughout therapy  Baseline:  Goal status: IN PROGRESS  2.  Pt will demonstrate improved posture due to transversus abdominus activation  and 5/5 bil hip strength Baseline:  Goal status: IN PROGRESS  3.  Pt will report 50% less symptoms from prolapse Baseline:  Goal status: IN PROGRESS  4.  Pt will be able to void without pushing Baseline:  Goal status: IN PROGRESS  5.  Pt will be able to sustain kegel for 20 seconds for endurance needed during normal household activities Baseline:  Goal status: IN PROGRESS  PLAN: PT FREQUENCY: 1x/week  PT DURATION: 12 weeks  PLANNED INTERVENTIONS: Therapeutic exercises, Therapeutic activity, Neuromuscular  re-education, Balance training, Gait training, Patient/Family education, Self Care, Joint mobilization, Dry Needling, Electrical stimulation, Cryotherapy, Moist heat, Taping, Biofeedback, and Manual therapy  PLAN FOR NEXT SESSION: posture coordinating core with pelvic floor contraction - arch lifting and kegel with squat and functional ex's/lifting   Brayton Caves Magdelyn Roebuck, PT 10/12/2021, 2:07 PM

## 2021-10-19 ENCOUNTER — Ambulatory Visit: Payer: BC Managed Care – PPO | Admitting: Physical Therapy

## 2021-10-19 DIAGNOSIS — M6281 Muscle weakness (generalized): Secondary | ICD-10-CM

## 2021-10-19 DIAGNOSIS — N816 Rectocele: Secondary | ICD-10-CM | POA: Diagnosis not present

## 2021-10-19 DIAGNOSIS — R293 Abnormal posture: Secondary | ICD-10-CM

## 2021-10-19 DIAGNOSIS — R279 Unspecified lack of coordination: Secondary | ICD-10-CM

## 2021-10-19 DIAGNOSIS — N814 Uterovaginal prolapse, unspecified: Secondary | ICD-10-CM | POA: Diagnosis not present

## 2021-10-19 NOTE — Therapy (Signed)
OUTPATIENT PHYSICAL THERAPY FEMALE PELVIC TREATMENT   Patient Name: Dawn Macdonald MRN: [id number] DOB:Apr 12, 1965, 56 y.o., female Today's Date: 10/19/2021   PT End of Session - 10/19/21 0939     Visit Number 3    Date for PT Re-Evaluation 12/28/21    Authorization Type bcbs    PT Start Time 0933    PT Stop Time 1012    PT Time Calculation (min) 39 min    Activity Tolerance Patient tolerated treatment well    Behavior During Therapy [facility] for tasks assessed/performed               Past Medical History:  Diagnosis Date   Depression    Hypothyroid    Sciatica    Varicose veins    Past Surgical History:  Procedure Laterality Date   implant tooth     TONSILLECTOMY     VARICOSE VEIN SURGERY  09/1997   Patient Active Problem List   Diagnosis Date Noted   Familial hypercholesteremia 01/08/2019   Hypothyroid    Varicose veins    Osteoporosis     PCP: Aletha Halim., PA-C  REFERRING PROVIDER: Tamela Gammon, NP  REFERRING DIAG:  N81.4 (ICD-10-CM) - Uterine prolapse  N81.6 (ICD-10-CM) - Rectocele    THERAPY DIAG:  Muscle weakness (generalized)  Abnormal posture  Unspecified lack of coordination  Rationale for Evaluation and Treatment Rehabilitation  ONSET DATE: insidious  SUBJECTIVE:                                                                                                                                                                                           SUBJECTIVE STATEMENT: I am about the same.  I had a lot of soreness across the abdomen after I left from the last time.  It went away after that night.  Now it is still about the same, just feel it all the time.  Not better or worse. Fluid intake: Yes:       PAIN:  Are you having pain? No  PRECAUTIONS: None  WEIGHT BEARING RESTRICTIONS No  FALLS:  Has patient fallen in last 6 months? No  LIVING ENVIRONMENT: Lives with: lives with their family and lives with their  spouse Lives in: House/apartment   OCCUPATION: pediatrician   PLOF: Independent  PATIENT GOALS reduce prolapse  PERTINENT HISTORY:  3 vaginal deliveries Sexual abuse: No  BOWEL MOVEMENT Pain with bowel movement: No Type of bowel movement: normal Fully empty rectum: Yes:     URINATION Pain with urination: No Fully empty bladder: No - takes longer and need to push Stream: Weak Urgency: No Frequency: normal  Leakage:  no Pads: No  INTERCOURSE Pain with intercourse: No   PREGNANCY Vaginal deliveries 3 Tearing Yes: first one   PROLAPSE None - uterine    OBJECTIVE:   DIAGNOSTIC FINDINGS:    PATIENT SURVEYS:    PFIQ-7   COGNITION:  Overall cognitive status: Within functional limits for tasks assessed     SENSATION:    MUSCLE LENGTH: Hamstrings: Right 70 deg; Left 90 deg Thomas test: Right  deg; Left  deg  LUMBAR SPECIAL TESTS:  SLR neg  FUNCTIONAL TESTS:  Single leg stand hip ER when standing on the Rt  GAIT: Comments: WFL                POSTURE: anterior pelvic tilt and low arches and weight to great toe bil bunions, hip IR, knees locked out   PELVIC ALIGNMENT: normal  LUMBARAROM/PROM  A/PROM A/PROM  eval  Flexion WFL   Extension   Right lateral flexion   Left lateral flexion   Right rotation   Left rotation    (Blank rows = not tested)  LOWER EXTREMITY ROM:              Passive ROM Right eval Left eval  Hip flexion    Hip extension    Hip abduction    Hip adduction    Hip internal rotation 75% 75%  Hip external rotation 75% 75%  Knee flexion    Knee extension    Ankle dorsiflexion    Ankle plantarflexion    Ankle inversion    Ankle eversion     (Blank rows = not tested)  LOWER EXTREMITY MMT:  MMT Right eval Left eval  Hip flexion 5/5 5/5  Hip extension 4/5 4/5  Hip abduction 4/5 5/5  Hip adduction 4/5 4+/5  Hip internal rotation 5/5 5/5  Hip external rotation 5/5 5/5  Knee flexion    Knee extension     Ankle dorsiflexion    Ankle plantarflexion    Ankle inversion    Ankle eversion      PALPATION:   General  lumbar and thoracic paraspinals tight                External Perineal Exam descended/gaping perineal body; redness and prolapse around urethra meatus; posterior and anterior vaginal wall weakness                             Internal Pelvic Floor levators weakly palpable at pubic rami; posterior pelvic floor tight  Patient confirms identification and approves PT to assess internal pelvic floor and treatment Yes  PELVIC MMT:   MMT eval  Vaginal 2/5 x 4 reps; 6 sec hold max  Internal Anal Sphincter   External Anal Sphincter   Puborectalis   Diastasis Recti   (Blank rows = not tested)        TONE: low  PROLAPSE: With valsalva prolapsing to slightly beyond vaginal opening  TODAY'S TREATMENT  Date: 10/19/21  Nuero Re-ed: Education and cues for posture  Exercises:  Supine: 5lb overhead with press up - 15x Red band diagonals - 20x Thoracic ext UE overhead with noodle - 10x Side lying:  thoracic rotation Standing:  Row and ext - red band - 20x Red band - side step and press - 15x bil Diagonal flexion with red band - 20x Ball roll up and side bend on wal Step down 2 inch fwd - 10x bil  Date: 10/12/21  Nuero Re-ed: Education and cues for coordination of breathing  Pelvic floor muscle contracting and relaxing at appropriate times during activities  Exercises:  Prone kegel: add SLR, knees bent and eccentric h/s - all 15-20 reps Quadruped kegel : neutral position and IR hip rotation position, pelvis elevated in rock pose - 15 reps Supine: Piriformis stretch Block hip ER and IR - 10x with exhale and kegel Marching Hip abduction core engaged Bridge - 15 Side lying: hip abduction - 20x each    PATIENT EDUCATION: Education details: Access Code: [email or web address] Person educated: Patient Education method: Programmer, multimedia, Demonstration, Actor cues, Verbal cues, and  Handouts Education comprehension: verbalized understanding and returned demonstration   HOME EXERCISE PROGRAM: Access Code: [email or web address] URL: [email or web address] Date: 10/12/2021 Prepared by: Dwana Curd  Exercises - Ball squeeze with Kegel  - 1 x daily - 7 x weekly - 1-2 sets - 10 reps - 2 sec hold - Hooklying Small March  - 1 x daily - 7 x weekly - 1-2 sets - 10 reps - 2 sec hold - Supine Bridge with Pelvic Floor Contraction  - 1 x daily - 7 x weekly - 1-2 sets - 10 reps - 3 sec hold - Sidelying Pelvic Floor Contraction with Hip Abduction  - 1 x daily - 7 x weekly - 2 sets - 10 reps - Prone Pelvic Floor Contraction on Swiss Ball  - 1 x daily - 7 x weekly - 2 sets - 10 reps  ASSESSMENT:  CLINICAL IMPRESSION: Today's treatment focused more on upper body and posture with coordination of pelvic floor. Pt needed cues throughout exercises for pelvic stability and lifting chest up and shoulders back.  Exercises added to HEP to continue to build on postural strength and corrections.  Pt will benefit from skilled PT to address all impairments in order to restore function without prolapse in order to manage symptoms without surgery.   OBJECTIVE IMPAIRMENTS decreased coordination, decreased endurance, decreased ROM, decreased strength, impaired flexibility, impaired tone, postural dysfunction, and pain.   ACTIVITY LIMITATIONS lifting, bending, and standing  PARTICIPATION LIMITATIONS: cleaning and community activity  PERSONAL FACTORS 1-2 comorbidities: history of 3 vaginal deliveries  are also affecting patient's functional outcome.   REHAB POTENTIAL: Good  CLINICAL DECISION MAKING: Stable/uncomplicated  EVALUATION COMPLEXITY: Low   GOALS: Goals reviewed with patient? Yes   LONG TERM GOALS: Target date: 12/29/2021   Pt will be independent with advanced HEP to maintain improvements made throughout therapy  Baseline:  Goal status: IN PROGRESS  2.  Pt will  demonstrate improved posture due to transversus abdominus activation  and 5/5 bil hip strength Baseline:  Goal status: IN PROGRESS  3.  Pt will report 50% less symptoms from prolapse Baseline:  Goal status: IN PROGRESS  4.  Pt will be able to void without pushing Baseline:  Goal status: IN PROGRESS  5.  Pt will be able to sustain kegel for 20 seconds for endurance needed during normal household activities Baseline:  Goal status: IN PROGRESS  PLAN: PT FREQUENCY: 1x/week  PT DURATION: 12 weeks  PLANNED INTERVENTIONS: Therapeutic exercises, Therapeutic activity, Neuromuscular re-education, Balance training, Gait training, Patient/Family education, Self Care, Joint mobilization, Dry Needling, Electrical stimulation, Cryotherapy, Moist heat, Taping, Biofeedback, and Manual therapy  PLAN FOR NEXT SESSION: continue posture coordinating core with pelvic floor contraction - arch lifting and kegel with squat and functional ex's/lifting   Brayton Caves Marisal Swarey, PT 10/19/2021, 9:40 AM

## 2021-10-26 ENCOUNTER — Ambulatory Visit: Payer: BC Managed Care – PPO | Admitting: Physical Therapy

## 2021-10-26 DIAGNOSIS — N814 Uterovaginal prolapse, unspecified: Secondary | ICD-10-CM | POA: Diagnosis not present

## 2021-10-26 DIAGNOSIS — R293 Abnormal posture: Secondary | ICD-10-CM

## 2021-10-26 DIAGNOSIS — M6281 Muscle weakness (generalized): Secondary | ICD-10-CM

## 2021-10-26 DIAGNOSIS — R279 Unspecified lack of coordination: Secondary | ICD-10-CM

## 2021-10-26 DIAGNOSIS — N816 Rectocele: Secondary | ICD-10-CM | POA: Diagnosis not present

## 2021-10-26 NOTE — Therapy (Signed)
OUTPATIENT PHYSICAL THERAPY FEMALE PELVIC TREATMENT   Patient Name: Dawn Macdonald MRN: [id number] DOB:08/05/1965, 56 y.o., female Today's Date: 10/26/2021       Past Medical History:  Diagnosis Date   Depression    Hypothyroid    Sciatica    Varicose veins    Past Surgical History:  Procedure Laterality Date   implant tooth     TONSILLECTOMY     VARICOSE VEIN SURGERY  09/1997   Patient Active Problem List   Diagnosis Date Noted   Familial hypercholesteremia 01/08/2019   Hypothyroid    Varicose veins    Osteoporosis     PCP: Aletha Halim., PA-C  REFERRING PROVIDER: Tamela Gammon, NP  REFERRING DIAG:  N81.4 (ICD-10-CM) - Uterine prolapse  N81.6 (ICD-10-CM) - Rectocele    THERAPY DIAG:  No diagnosis found.  Rationale for Evaluation and Treatment Rehabilitation  ONSET DATE: insidious  SUBJECTIVE:                                                                                                                                                                                           SUBJECTIVE STATEMENT: I am maybe a little better. Still hard to tell Fluid intake: Yes:       PAIN:  Are you having pain? No  PRECAUTIONS: None  WEIGHT BEARING RESTRICTIONS No  FALLS:  Has patient fallen in last 6 months? No  LIVING ENVIRONMENT: Lives with: lives with their family and lives with their spouse Lives in: House/apartment   OCCUPATION: pediatrician   PLOF: Independent  PATIENT GOALS reduce prolapse  PERTINENT HISTORY:  3 vaginal deliveries Sexual abuse: No  BOWEL MOVEMENT Pain with bowel movement: No Type of bowel movement: normal Fully empty rectum: Yes:     URINATION Pain with urination: No Fully empty bladder: No - takes longer and need to push Stream: Weak Urgency: No Frequency: normal Leakage:  no Pads: No  INTERCOURSE Pain with intercourse: No   PREGNANCY Vaginal deliveries 3 Tearing Yes: first  one   PROLAPSE None - uterine    OBJECTIVE:   DIAGNOSTIC FINDINGS:    PATIENT SURVEYS:    PFIQ-7   COGNITION:  Overall cognitive status: Within functional limits for tasks assessed     SENSATION:    MUSCLE LENGTH: Hamstrings: Right 70 deg; Left 90 deg Thomas test: Right  deg; Left  deg  LUMBAR SPECIAL TESTS:  SLR neg  FUNCTIONAL TESTS:  Single leg stand hip ER when standing on the Rt  GAIT: Comments: WFL                POSTURE: anterior pelvic tilt  and low arches and weight to great toe bil bunions, hip IR, knees locked out   PELVIC ALIGNMENT: normal  LUMBARAROM/PROM  A/PROM A/PROM  eval  Flexion WFL   Extension   Right lateral flexion   Left lateral flexion   Right rotation   Left rotation    (Blank rows = not tested)  LOWER EXTREMITY ROM:              Passive ROM Right eval Left eval  Hip flexion    Hip extension    Hip abduction    Hip adduction    Hip internal rotation 75% 75%  Hip external rotation 75% 75%  Knee flexion    Knee extension    Ankle dorsiflexion    Ankle plantarflexion    Ankle inversion    Ankle eversion     (Blank rows = not tested)  LOWER EXTREMITY MMT:  MMT Right eval Left eval  Hip flexion 5/5 5/5  Hip extension 4/5 4/5  Hip abduction 4/5 5/5  Hip adduction 4/5 4+/5  Hip internal rotation 5/5 5/5  Hip external rotation 5/5 5/5  Knee flexion    Knee extension    Ankle dorsiflexion    Ankle plantarflexion    Ankle inversion    Ankle eversion      PALPATION:   General  lumbar and thoracic paraspinals tight                External Perineal Exam descended/gaping perineal body; redness and prolapse around urethra meatus; posterior and anterior vaginal wall weakness                             Internal Pelvic Floor levators weakly palpable at pubic rami; posterior pelvic floor tight  Patient confirms identification and approves PT to assess internal pelvic floor and treatment Yes  PELVIC MMT:   MMT  eval  Vaginal 2/5 x 4 reps; 6 sec hold max  Internal Anal Sphincter   External Anal Sphincter   Puborectalis   Diastasis Recti   (Blank rows = not tested)        TONE: low  PROLAPSE: With valsalva prolapsing to slightly beyond vaginal opening  TODAY'S TREATMENT  Date: 10/26/21  Nuero Re-ed: Education and cues for posture Cues to lift the arches and keep knees unlocked during all standing exercises  Exercises:  Supine: 5lb overhead with press up - 15x Red band diagonals - 20x Thoracic ext UE overhead with noodle - 10x quadruped:  thoracic rotation Standing:  Ext - red band - 20x Red band - step forward - 20x Wall with post pelvic tilt and green loop hip abduction - 20x Wall with post pelvic tilt and green loop hip abduction - add UE lift 2lb - 20x Hip abduction and flexion green loop - 15x bil Hip ext in plank at the bar - 15x bil Mini bird dip one UE support 10x bil Step down 2 inch fwd foam mat- 10x bil  Date: 10/19/21  Nuero Re-ed: Education and cues for posture  Exercises:  Supine: 5lb overhead with press up - 15x Red band diagonals - 20x Thoracic ext UE overhead with noodle - 10x Side lying:  thoracic rotation Standing:  Row and ext - red band - 20x Red band - side step and press - 15x bil Diagonal flexion with red band - 20x Ball roll up and side bend on wal Step down 2 inch fwd -  10x bil  Date: 10/12/21  Nuero Re-ed: Education and cues for coordination of breathing  Pelvic floor muscle contracting and relaxing at appropriate times during activities  Exercises:  Prone kegel: add SLR, knees bent and eccentric h/s - all 15-20 reps Quadruped kegel : neutral position and IR hip rotation position, pelvis elevated in rock pose - 15 reps Supine: Piriformis stretch Block hip ER and IR - 10x with exhale and kegel Marching Hip abduction core engaged Bridge - 15 Side lying: hip abduction - 20x each    PATIENT EDUCATION: Education details: Access  Code: [place] Person educated: Patient Education method: Consulting civil engineer, Demonstration, Corporate treasurer cues, Verbal cues, and Handouts Education comprehension: verbalized understanding and returned demonstration   HOME EXERCISE PROGRAM: Access Code: [email or web address] URL: [email or web address] Date: 10/12/2021 Prepared by: Jari Favre  Exercises - Ball squeeze with Kegel  - 1 x daily - 7 x weekly - 1-2 sets - 10 reps - 2 sec hold - Hooklying Small March  - 1 x daily - 7 x weekly - 1-2 sets - 10 reps - 2 sec hold - Supine Bridge with Pelvic Floor Contraction  - 1 x daily - 7 x weekly - 1-2 sets - 10 reps - 3 sec hold - Sidelying Pelvic Floor Contraction with Hip Abduction  - 1 x daily - 7 x weekly - 2 sets - 10 reps - Prone Pelvic Floor Contraction on Swiss Ball  - 1 x daily - 7 x weekly - 2 sets - 10 reps  ASSESSMENT:  CLINICAL IMPRESSION: Today's treatment focused more on upper body and posture with coordination of pelvic floor. Pt needed cues throughout exercises for pelvic stability and lifting chest up and shoulders back.  Exercises added to HEP to continue to build on postural strength and corrections.  Pt will benefit from skilled PT to address all impairments in order to restore function without prolapse in order to manage symptoms without surgery.   OBJECTIVE IMPAIRMENTS decreased coordination, decreased endurance, decreased ROM, decreased strength, impaired flexibility, impaired tone, postural dysfunction, and pain.   ACTIVITY LIMITATIONS lifting, bending, and standing  PARTICIPATION LIMITATIONS: cleaning and community activity  PERSONAL FACTORS 1-2 comorbidities: history of 3 vaginal deliveries  are also affecting patient's functional outcome.   REHAB POTENTIAL: Good  CLINICAL DECISION MAKING: Stable/uncomplicated  EVALUATION COMPLEXITY: Low   GOALS: Goals reviewed with patient? Yes   LONG TERM GOALS: Target date: 12/29/2021   Pt will be independent with  advanced HEP to maintain improvements made throughout therapy  Baseline:  Goal status: IN PROGRESS  2.  Pt will demonstrate improved posture due to transversus abdominus activation  and 5/5 bil hip strength Baseline:  Goal status: IN PROGRESS  3.  Pt will report 50% less symptoms from prolapse Baseline:  Goal status: IN PROGRESS  4.  Pt will be able to void without pushing Baseline:  Goal status: IN PROGRESS  5.  Pt will be able to sustain kegel for 20 seconds for endurance needed during normal household activities Baseline:  Goal status: IN PROGRESS  PLAN: PT FREQUENCY: 1x/week  PT DURATION: 12 weeks  PLANNED INTERVENTIONS: Therapeutic exercises, Therapeutic activity, Neuromuscular re-education, Balance training, Gait training, Patient/Family education, Self Care, Joint mobilization, Dry Needling, Electrical stimulation, Cryotherapy, Moist heat, Taping, Biofeedback, and Manual therapy  PLAN FOR NEXT SESSION: re-assess goals, continue posture coordinating core with pelvic floor contraction - arch lifting and kegel with squat and functional ex's/lifting;    Jule Ser, PT 10/26/2021, 7:47 AM

## 2021-11-01 NOTE — Therapy (Unsigned)
OUTPATIENT PHYSICAL THERAPY FEMALE PELVIC TREATMENT   Patient Name: Dawn Macdonald MRN: [id number] DOB:February 27, 1965, 56 y.o., female Today's Date: 11/02/2021   PT End of Session - 11/02/21 0852     Visit Number 5    Date for PT Re-Evaluation 12/28/21    Authorization Type bcbs    PT Start Time 0844    PT Stop Time 0924    PT Time Calculation (min) 40 min    Activity Tolerance Patient tolerated treatment well    Behavior During Therapy [facility] for tasks assessed/performed                Past Medical History:  Diagnosis Date   Depression    Hypothyroid    Sciatica    Varicose veins    Past Surgical History:  Procedure Laterality Date   implant tooth     TONSILLECTOMY     VARICOSE VEIN SURGERY  09/1997   Patient Active Problem List   Diagnosis Date Noted   Familial hypercholesteremia 01/08/2019   Hypothyroid    Varicose veins    Osteoporosis     PCP: Richmond Campbell., PA-C  REFERRING PROVIDER: Olivia Mackie, NP  REFERRING DIAG:  N81.4 (ICD-10-CM) - Uterine prolapse  N81.6 (ICD-10-CM) - Rectocele    THERAPY DIAG:  Muscle weakness (generalized)  Abnormal posture  Unspecified lack of coordination  Rationale for Evaluation and Treatment Rehabilitation  ONSET DATE: insidious  SUBJECTIVE:                                                                                                                                                                                           SUBJECTIVE STATEMENT: I feeling about the same, still feel the prolapse just being there. Fluid intake: Yes:       PAIN:  Are you having pain? No  PRECAUTIONS: None  WEIGHT BEARING RESTRICTIONS No  FALLS:  Has patient fallen in last 6 months? No  LIVING ENVIRONMENT: Lives with: lives with their family and lives with their spouse Lives in: House/apartment   OCCUPATION: pediatrician   PLOF: Independent  PATIENT GOALS reduce prolapse  PERTINENT HISTORY:  3 vaginal  deliveries Sexual abuse: No  BOWEL MOVEMENT Pain with bowel movement: No Type of bowel movement: normal Fully empty rectum: Yes:     URINATION Pain with urination: No Fully empty bladder: No - takes longer and need to push Stream: Weak Urgency: No Frequency: normal Leakage:  no Pads: No  INTERCOURSE Pain with intercourse: No   PREGNANCY Vaginal deliveries 3 Tearing Yes: first one   PROLAPSE None - uterine    OBJECTIVE:   DIAGNOSTIC FINDINGS:  PATIENT SURVEYS:    PFIQ-7   COGNITION:  Overall cognitive status: Within functional limits for tasks assessed     SENSATION:    MUSCLE LENGTH: Hamstrings: Right 70 deg; Left 90 deg Thomas test: Right  deg; Left  deg  LUMBAR SPECIAL TESTS:  SLR neg  FUNCTIONAL TESTS:  Single leg stand hip ER when standing on the Rt  GAIT: Comments: WFL                POSTURE: anterior pelvic tilt and low arches and weight to great toe bil bunions, hip IR, knees locked out   PELVIC ALIGNMENT: normal  LUMBARAROM/PROM  A/PROM A/PROM  eval  Flexion WFL   Extension   Right lateral flexion   Left lateral flexion   Right rotation   Left rotation    (Blank rows = not tested)  LOWER EXTREMITY ROM:              Passive ROM Right eval Left eval  Hip flexion    Hip extension    Hip abduction    Hip adduction    Hip internal rotation 75% 75%  Hip external rotation 75% 75%  Knee flexion    Knee extension    Ankle dorsiflexion    Ankle plantarflexion    Ankle inversion    Ankle eversion     (Blank rows = not tested)  LOWER EXTREMITY MMT:  MMT Right eval Left eval  Hip flexion 5/5 5/5  Hip extension 4/5 4/5  Hip abduction 4/5 5/5  Hip adduction 4/5 4+/5  Hip internal rotation 5/5 5/5  Hip external rotation 5/5 5/5  Knee flexion    Knee extension    Ankle dorsiflexion    Ankle plantarflexion    Ankle inversion    Ankle eversion      PALPATION:   General  lumbar and thoracic paraspinals tight                 External Perineal Exam descended/gaping perineal body; redness and prolapse around urethra meatus; posterior and anterior vaginal wall weakness                             Internal Pelvic Floor levators weakly palpable at pubic rami; posterior pelvic floor tight  Patient confirms identification and approves PT to assess internal pelvic floor and treatment Yes  PELVIC MMT:   MMT eval  Vaginal 2/5 x 4 reps; 6 sec hold max  Internal Anal Sphincter   External Anal Sphincter   Puborectalis   Diastasis Recti   (Blank rows = not tested)        TONE: low  PROLAPSE: With valsalva prolapsing to slightly beyond vaginal opening  TODAY'S TREATMENT  Date: 11/02/21  Nuero Re-ed: Education and cues for posture Cues to lift the arches and keep knees unlocked during all standing exercises  Exercises:  Supine: Ball overhead tapping LE in 90-90- 20x Bil LE 90-90 ball overhead- 20x Bridges with march 15x Horizontal abduction green band and diagonals - 10x each quadruped:   Half kneel Pallof press with green band Standing:  Ext - red band - 20x Red band - step forward - 20x Wall with post pelvic tilt and ball squeeze 5 sec hold - 20x Hip abduction and flexion yellow loop - 15x bil Hip ext in plank at the bar yellow loop- 15x bil Torque in single leg support - green band with punch forward Squat  x 10; add pallof with green band 10x each side - cues to lift arches  Date: 10/26/21  Nuero Re-ed: Education and cues for posture Cues to lift the arches and keep knees unlocked during all standing exercises  Exercises:  Supine: 5lb overhead with press up - 15x Red band diagonals - 20x Thoracic ext UE overhead with noodle - 10x quadruped:  thoracic rotation Standing:  Ext - red band - 20x Red band - step forward - 20x Wall with post pelvic tilt and green loop hip abduction - 20x Wall with post pelvic tilt and green loop hip abduction - add UE lift 2lb - 20x Hip  abduction and flexion green loop - 15x bil Hip ext in plank at the bar - 15x bil Mini bird dip one UE support 10x bil Step down 2 inch fwd foam mat- 10x bil  Date: 10/19/21  Nuero Re-ed: Education and cues for posture  Exercises:  Supine: 5lb overhead with press up - 15x Red band diagonals - 20x Thoracic ext UE overhead with noodle - 10x Side lying:  thoracic rotation Standing:  Row and ext - red band - 20x Red band - side step and press - 15x bil Diagonal flexion with red band - 20x Ball roll up and side bend on wal Step down 2 inch fwd - 10x bil     PATIENT EDUCATION: Education details: Access Code: [place] Person educated: Patient Education method: Explanation, Demonstration, Tactile cues, Verbal cues, and Handouts Education comprehension: verbalized understanding and returned demonstration   HOME EXERCISE PROGRAM: Access Code: [email or web address] URL: [email or web address] Date: 10/12/2021 Prepared by: Jari Favre  Exercises - Ball squeeze with Kegel  - 1 x daily - 7 x weekly - 1-2 sets - 10 reps - 2 sec hold - Hooklying Small March  - 1 x daily - 7 x weekly - 1-2 sets - 10 reps - 2 sec hold - Supine Bridge with Pelvic Floor Contraction  - 1 x daily - 7 x weekly - 1-2 sets - 10 reps - 3 sec hold - Sidelying Pelvic Floor Contraction with Hip Abduction  - 1 x daily - 7 x weekly - 2 sets - 10 reps - Prone Pelvic Floor Contraction on Swiss Ball  - 1 x daily - 7 x weekly - 2 sets - 10 reps  ASSESSMENT:  CLINICAL IMPRESSION: Pt continues to have small amount of improvement with exercises.  She continues to need some cues for posture throughout the session. Today focused more on lifting the arches in standing.  She tends to lock out her knees and use upper traps during standing exercises.  Able to add to HEP for progressing exercises today.  Pt will benefit from skilled PT to address all impairments in order to restore function without prolapse in order to  manage symptoms without surgery.   OBJECTIVE IMPAIRMENTS decreased coordination, decreased endurance, decreased ROM, decreased strength, impaired flexibility, impaired tone, postural dysfunction, and pain.   ACTIVITY LIMITATIONS lifting, bending, and standing  PARTICIPATION LIMITATIONS: cleaning and community activity  PERSONAL FACTORS 1-2 comorbidities: history of 3 vaginal deliveries  are also affecting patient's functional outcome.   REHAB POTENTIAL: Good  CLINICAL DECISION MAKING: Stable/uncomplicated  EVALUATION COMPLEXITY: Low   GOALS: Goals reviewed with patient? Yes   LONG TERM GOALS: Target date: 12/29/2021   Updated 11/02/21  Pt will be independent with advanced HEP to maintain improvements made throughout therapy  Baseline:  Goal status: IN PROGRESS  2.  Pt will demonstrate improved  posture due to transversus abdominus activation  and 5/5 bil hip strength Baseline:  Goal status: IN PROGRESS  3.  Pt will report 50% less symptoms from prolapse Baseline: maybe a little better Goal status: IN PROGRESS  4.  Pt will be able to void without pushing Baseline: still have to push Goal status: IN PROGRESS  5.  Pt will be able to sustain kegel for 20 seconds for endurance needed during normal household activities Baseline:  Goal status: IN PROGRESS  PLAN: PT FREQUENCY: 1x/week  PT DURATION: 12 weeks  PLANNED INTERVENTIONS: Therapeutic exercises, Therapeutic activity, Neuromuscular re-education, Balance training, Gait training, Patient/Family education, Self Care, Joint mobilization, Dry Needling, Electrical stimulation, Cryotherapy, Moist heat, Taping, Biofeedback, and Manual therapy  PLAN FOR NEXT SESSION: re-assess goals, continue posture coordinating core with pelvic floor contraction - arch lifting and kegel with squat and functional ex's/lifting;    Junious Silk, PT 11/02/2021, 9:38 AM

## 2021-11-02 ENCOUNTER — Ambulatory Visit: Payer: BC Managed Care – PPO | Attending: Nurse Practitioner | Admitting: Physical Therapy

## 2021-11-02 ENCOUNTER — Encounter: Payer: Self-pay | Admitting: Physical Therapy

## 2021-11-02 DIAGNOSIS — R293 Abnormal posture: Secondary | ICD-10-CM | POA: Diagnosis not present

## 2021-11-02 DIAGNOSIS — M6281 Muscle weakness (generalized): Secondary | ICD-10-CM | POA: Diagnosis not present

## 2021-11-02 DIAGNOSIS — R279 Unspecified lack of coordination: Secondary | ICD-10-CM | POA: Insufficient documentation

## 2021-11-09 ENCOUNTER — Ambulatory Visit: Payer: BC Managed Care – PPO | Admitting: Physical Therapy

## 2021-11-09 ENCOUNTER — Encounter: Payer: Self-pay | Admitting: Physical Therapy

## 2021-11-09 DIAGNOSIS — R293 Abnormal posture: Secondary | ICD-10-CM | POA: Diagnosis not present

## 2021-11-09 DIAGNOSIS — M6281 Muscle weakness (generalized): Secondary | ICD-10-CM

## 2021-11-09 DIAGNOSIS — R279 Unspecified lack of coordination: Secondary | ICD-10-CM

## 2021-11-09 NOTE — Therapy (Signed)
OUTPATIENT PHYSICAL THERAPY FEMALE PELVIC TREATMENT   Patient Name: Dawn Macdonald MRN: [id number] DOB:1965-08-09, 56 y.o., female Today's Date: 11/09/2021   PT End of Session - 11/09/21 1618     Visit Number 6    Date for PT Re-Evaluation 12/28/21    Authorization Type bcbs    PT Start Time 1532    PT Stop Time 1614    PT Time Calculation (min) 42 min    Activity Tolerance Patient tolerated treatment well    Behavior During Therapy [facility] for tasks assessed/performed                 Past Medical History:  Diagnosis Date   Depression    Hypothyroid    Sciatica    Varicose veins    Past Surgical History:  Procedure Laterality Date   implant tooth     TONSILLECTOMY     VARICOSE VEIN SURGERY  09/1997   Patient Active Problem List   Diagnosis Date Noted   Familial hypercholesteremia 01/08/2019   Hypothyroid    Varicose veins    Osteoporosis     PCP: Richmond Campbell., PA-C  REFERRING PROVIDER: Olivia Mackie, NP  REFERRING DIAG:  N81.4 (ICD-10-CM) - Uterine prolapse  N81.6 (ICD-10-CM) - Rectocele    THERAPY DIAG:  Muscle weakness (generalized)  Abnormal posture  Unspecified lack of coordination  Rationale for Evaluation and Treatment Rehabilitation  ONSET DATE: insidious  SUBJECTIVE:                                                                                                                                                                                           SUBJECTIVE STATEMENT: I feeling about the same, still feel the prolapse just being there. Fluid intake: Yes:       PAIN:  Are you having pain? No  PRECAUTIONS: None  WEIGHT BEARING RESTRICTIONS No  FALLS:  Has patient fallen in last 6 months? No  LIVING ENVIRONMENT: Lives with: lives with their family and lives with their spouse Lives in: House/apartment   OCCUPATION: pediatrician   PLOF: Independent  PATIENT GOALS reduce prolapse  PERTINENT HISTORY:  3  vaginal deliveries Sexual abuse: No  BOWEL MOVEMENT Pain with bowel movement: No Type of bowel movement: normal Fully empty rectum: Yes:     URINATION Pain with urination: No Fully empty bladder: No - takes longer and need to push Stream: Weak Urgency: No Frequency: normal Leakage:  no Pads: No  INTERCOURSE Pain with intercourse: No   PREGNANCY Vaginal deliveries 3 Tearing Yes: first one   PROLAPSE None - uterine    OBJECTIVE:   DIAGNOSTIC  FINDINGS:    PATIENT SURVEYS:    PFIQ-7   COGNITION:  Overall cognitive status: Within functional limits for tasks assessed     SENSATION:    MUSCLE LENGTH: Hamstrings: Right 70 deg; Left 90 deg Thomas test: Right  deg; Left  deg  LUMBAR SPECIAL TESTS:  SLR neg  FUNCTIONAL TESTS:  Single leg stand hip ER when standing on the Rt  GAIT: Comments: WFL                POSTURE: anterior pelvic tilt and low arches and weight to great toe bil bunions, hip IR, knees locked out   PELVIC ALIGNMENT: normal  LUMBARAROM/PROM  A/PROM A/PROM  eval  Flexion WFL   Extension   Right lateral flexion   Left lateral flexion   Right rotation   Left rotation    (Blank rows = not tested)  LOWER EXTREMITY ROM:              Passive ROM Right eval Left eval  Hip flexion    Hip extension    Hip abduction    Hip adduction    Hip internal rotation 75% 75%  Hip external rotation 75% 75%  Knee flexion    Knee extension    Ankle dorsiflexion    Ankle plantarflexion    Ankle inversion    Ankle eversion     (Blank rows = not tested)  LOWER EXTREMITY MMT:  MMT Right eval Left eval Rt/Lt Out of 5 11/09/21   Hip flexion 5/5 5/5 5/5  Hip extension 4/5 4/5 5/5  Hip abduction 4/5 5/5 4/4+  Hip adduction 4/5 4+/5 5/5  Hip internal rotation 5/5 5/5 5/5  Hip external rotation 5/5 5/5 5/5  Knee flexion     Knee extension     Ankle dorsiflexion     Ankle plantarflexion     Ankle inversion     Ankle eversion        PALPATION:   General  lumbar and thoracic paraspinals tight                External Perineal Exam descended/gaping perineal body; redness and prolapse around urethra meatus; posterior and anterior vaginal wall weakness                             Internal Pelvic Floor levators weakly palpable at pubic rami; posterior pelvic floor tight  Patient confirms identification and approves PT to assess internal pelvic floor and treatment Yes  PELVIC MMT:   MMT eval  Vaginal 2/5 x 4 reps; 6 sec hold max  Internal Anal Sphincter   External Anal Sphincter   Puborectalis   Diastasis Recti   (Blank rows = not tested)        TONE: low  PROLAPSE: With valsalva prolapsing to slightly beyond vaginal opening  TODAY'S TREATMENT  Date: 11/02/21  Nuero Re-ed: Education and cues for posture Cues to lift the arches and keep knees unlocked during all standing exercises  Exercises:  Supine: Ball pass back and forth Bil LE 90-90 green band LE ext- 20x Bridges with pball 15x Bridges with march 10x Quadruped:  Primal press 5 x 5 sec Green band alt UE/LE - 8x bil Half kneel Drawing sward with green band in half and tall kneel - 20x Tall kneel bridge with green band resistance Standing:   Single leg Glute med presses at the wall LE 90-90 - 3 x5  Date: 11/02/21  Nuero Re-ed: Education and cues for posture Cues to lift the arches and keep knees unlocked during all standing exercises  Exercises:  Supine: Ball overhead tapping LE in 90-90- 20x Bil LE 90-90 ball overhead- 20x Bridges with march 15x Horizontal abduction green band and diagonals - 10x each quadruped:   Half kneel Pallof press with green band Standing:  Ext - red band - 20x Red band - step forward - 20x Wall with post pelvic tilt and ball squeeze 5 sec hold - 20x Hip abduction and flexion yellow loop - 15x bil Hip ext in plank at the bar yellow loop- 15x bil Torque in single leg support - green band with punch  forward Squat x 10; add pallof with green band 10x each side - cues to lift arches  Date: 10/26/21  Nuero Re-ed: Education and cues for posture Cues to lift the arches and keep knees unlocked during all standing exercises  Exercises:  Supine: 5lb overhead with press up - 15x Red band diagonals - 20x Thoracic ext UE overhead with noodle - 10x quadruped:  thoracic rotation Standing:  Ext - red band - 20x Red band - step forward - 20x Wall with post pelvic tilt and green loop hip abduction - 20x Wall with post pelvic tilt and green loop hip abduction - add UE lift 2lb - 20x Hip abduction and flexion green loop - 15x bil Hip ext in plank at the bar - 15x bil Mini bird dip one UE support 10x bil Step down 2 inch fwd foam mat- 10x bil     PATIENT EDUCATION: Education details: Access Code: [email or web address] exercise progressions Person educated: Patient Education method: Explanation, Demonstration, Tactile cues, Verbal cues, and Handouts Education comprehension: verbalized understanding and returned demonstration   HOME EXERCISE PROGRAM: Exercises on medbridge  ASSESSMENT:  CLINICAL IMPRESSION: Pt reports no improvements with prolapse but is feeling stronger.  Pt is not having increased prolapse and not pushing much to pee.  Pt was able to make more progress with exercise difficulty. MMT on bil hip demonstrates 5/5 strength except for slight abduction weakness. She will benefit from skilled PT to continue to address overall strength   OBJECTIVE IMPAIRMENTS decreased coordination, decreased endurance, decreased ROM, decreased strength, impaired flexibility, impaired tone, postural dysfunction, and pain.   ACTIVITY LIMITATIONS lifting, bending, and standing  PARTICIPATION LIMITATIONS: cleaning and community activity  PERSONAL FACTORS 1-2 comorbidities: history of 3 vaginal deliveries  are also affecting patient's functional outcome.   REHAB POTENTIAL: Good  CLINICAL DECISION  MAKING: Stable/uncomplicated  EVALUATION COMPLEXITY: Low   GOALS: Goals reviewed with patient? Yes   LONG TERM GOALS: Target date: 12/29/2021   Updated 11/09/21  Pt will be independent with advanced HEP to maintain improvements made throughout therapy  Baseline:  Goal status: IN PROGRESS  2.  Pt will demonstrate improved posture due to transversus abdominus activation  and 5/5 bil hip strength Baseline: 5/5 except Rt abduction 4/5; Lt abduction 4+/5 Goal status: IN PROGRESS  3.  Pt will report 50% less symptoms from prolapse Baseline: maybe a little better Goal status: IN PROGRESS  4.  Pt will be able to void without pushing Baseline: still have to push Goal status: IN PROGRESS  5.  Pt will be able to sustain kegel for 20 seconds for endurance needed during normal household activities Baseline:  Goal status: IN PROGRESS  PLAN: PT FREQUENCY: 1x/week  PT DURATION: 12 weeks  PLANNED INTERVENTIONS: Therapeutic exercises, Therapeutic activity, Neuromuscular re-education,  Balance training, Gait training, Patient/Family education, Self Care, Joint mobilization, Dry Needling, Electrical stimulation, Cryotherapy, Moist heat, Taping, Biofeedback, and Manual therapy  PLAN FOR NEXT SESSION: re-assess goals, final HEP   Junious Silk, PT 11/09/2021, 4:19 PM

## 2021-11-29 DIAGNOSIS — H25013 Cortical age-related cataract, bilateral: Secondary | ICD-10-CM | POA: Diagnosis not present

## 2021-11-29 DIAGNOSIS — H47021 Hemorrhage in optic nerve sheath, right eye: Secondary | ICD-10-CM | POA: Diagnosis not present

## 2021-11-29 DIAGNOSIS — H2513 Age-related nuclear cataract, bilateral: Secondary | ICD-10-CM | POA: Diagnosis not present

## 2021-11-29 DIAGNOSIS — H401231 Low-tension glaucoma, bilateral, mild stage: Secondary | ICD-10-CM | POA: Diagnosis not present

## 2021-11-29 NOTE — Therapy (Unsigned)
OUTPATIENT PHYSICAL THERAPY FEMALE PELVIC TREATMENT   Patient Name: Dawn Macdonald MRN: [id number] DOB:1965/10/31, 56 y.o., female Today's Date: 11/30/2021   PT End of Session - 11/30/21 0934     Visit Number 7    Date for PT Re-Evaluation 12/28/21    Authorization Type bcbs    PT Start Time 0931    PT Stop Time 1013    PT Time Calculation (min) 42 min    Activity Tolerance Patient tolerated treatment well    Behavior During Therapy [facility] for tasks assessed/performed                  Past Medical History:  Diagnosis Date   Depression    Hypothyroid    Sciatica    Varicose veins    Past Surgical History:  Procedure Laterality Date   implant tooth     TONSILLECTOMY     VARICOSE VEIN SURGERY  09/1997   Patient Active Problem List   Diagnosis Date Noted   Familial hypercholesteremia 01/08/2019   Hypothyroid    Varicose veins    Osteoporosis     PCP: Aletha Halim., PA-C  REFERRING PROVIDER: Tamela Gammon, NP  REFERRING DIAG:  N81.4 (ICD-10-CM) - Uterine prolapse  N81.6 (ICD-10-CM) - Rectocele    THERAPY DIAG:  Muscle weakness (generalized)  Abnormal posture  Unspecified lack of coordination  Rationale for Evaluation and Treatment Rehabilitation  ONSET DATE: insidious  SUBJECTIVE:                                                                                                                                                                                           SUBJECTIVE STATEMENT: I feeling about the same, still feel the prolapse just being there. Fluid intake: Yes:       PAIN:  Are you having pain? No  PRECAUTIONS: None  WEIGHT BEARING RESTRICTIONS No  FALLS:  Has patient fallen in last 6 months? No  LIVING ENVIRONMENT: Lives with: lives with their family and lives with their spouse Lives in: House/apartment   OCCUPATION: pediatrician   PLOF: Independent  PATIENT GOALS reduce prolapse  PERTINENT HISTORY:  3  vaginal deliveries Sexual abuse: No  BOWEL MOVEMENT Pain with bowel movement: No Type of bowel movement: normal Fully empty rectum: Yes:     URINATION Pain with urination: No Fully empty bladder: No - takes longer and need to push Stream: Weak Urgency: No Frequency: normal Leakage:  no Pads: No  INTERCOURSE Pain with intercourse: No   PREGNANCY Vaginal deliveries 3 Tearing Yes: first one   PROLAPSE None - uterine    OBJECTIVE:  DIAGNOSTIC FINDINGS:    PATIENT SURVEYS:    PFIQ-7   COGNITION:  Overall cognitive status: Within functional limits for tasks assessed     SENSATION:    MUSCLE LENGTH: Hamstrings: Right 70 deg; Left 90 deg Thomas test: Right  deg; Left  deg  LUMBAR SPECIAL TESTS:  SLR neg  FUNCTIONAL TESTS:  Single leg stand hip ER when standing on the Rt  GAIT: Comments: WFL                POSTURE: anterior pelvic tilt and low arches and weight to great toe bil bunions, hip IR, knees locked out   PELVIC ALIGNMENT: normal  LUMBARAROM/PROM  A/PROM A/PROM  eval  Flexion WFL   Extension   Right lateral flexion   Left lateral flexion   Right rotation   Left rotation    (Blank rows = not tested)  LOWER EXTREMITY ROM:              Passive ROM Right eval Left eval  Hip flexion    Hip extension    Hip abduction    Hip adduction    Hip internal rotation 75% 75%  Hip external rotation 75% 75%  Knee flexion    Knee extension    Ankle dorsiflexion    Ankle plantarflexion    Ankle inversion    Ankle eversion     (Blank rows = not tested)  LOWER EXTREMITY MMT:  MMT Right eval Left eval Rt/Lt Out of 5 11/09/21   Hip flexion 5/5 5/5 5/5  Hip extension 4/5 4/5 5/5  Hip abduction 4/5 5/5 4/4+  Hip adduction 4/5 4+/5 5/5  Hip internal rotation 5/5 5/5 5/5  Hip external rotation 5/5 5/5 5/5  Knee flexion     Knee extension     Ankle dorsiflexion     Ankle plantarflexion     Ankle inversion     Ankle eversion        PALPATION:   General  lumbar and thoracic paraspinals tight                External Perineal Exam descended/gaping perineal body; redness and prolapse around urethra meatus; posterior and anterior vaginal wall weakness                             Internal Pelvic Floor levators weakly palpable at pubic rami; posterior pelvic floor tight  Patient confirms identification and approves PT to assess internal pelvic floor and treatment Yes  PELVIC MMT:   MMT eval  Vaginal 2/5 x 4 reps; 6 sec hold max  Internal Anal Sphincter   External Anal Sphincter   Puborectalis   Diastasis Recti   (Blank rows = not tested)        TONE: low  PROLAPSE: With valsalva prolapsing to slightly beyond vaginal opening  TODAY'S TREATMENT  Date: 11/30/21  Nuero Re-ed: Education and cues for posture Cues to lift the arches and keep knees unlocked during all standing exercises  Exercises:  Supine: Ball pass back and forth Bil LE 90-90 green band LE ext- 20x Bridges with one LE up - 3x 5 Quadruped:  Primal press 5 x 5 sec Green band alt UE/LE - 8x bil Half kneel Drawing sward with green band in half and tall kneel - 20x Tall kneel bridge with green band resistance Standing:   Single leg Glute med presses at the wall LE 90-90 - 3 x5  Mini backward lunge with cross body presses on cable machine - 15 x Backward walking on treadmill - incline 5 x 4 min 1.0 speed Seated: Nustep L6 x 6 min warm up and status update  Date: 11/09/21  Nuero Re-ed: Education and cues for posture Cues to lift the arches and keep knees unlocked during all standing exercises  Exercises:  Supine: Ball pass back and forth Bil LE 90-90 green band LE ext- 20x Bridges with pball 15x Bridges with march 10x Quadruped:  Primal press 5 x 5 sec Green band alt UE/LE - 8x bil Half kneel Drawing sward with green band in half and tall kneel - 20x Tall kneel bridge with green band resistance Standing:   Single leg Glute  med presses at the wall LE 90-90 - 3 x5   Date: 11/02/21  Nuero Re-ed: Education and cues for posture Cues to lift the arches and keep knees unlocked during all standing exercises  Exercises:  Supine: Ball overhead tapping LE in 90-90- 20x Bil LE 90-90 ball overhead- 20x Bridges with march 15x Horizontal abduction green band and diagonals - 10x each quadruped:   Half kneel Pallof press with green band Standing:  Ext - red band - 20x Red band - step forward - 20x Wall with post pelvic tilt and ball squeeze 5 sec hold - 20x Hip abduction and flexion yellow loop - 15x bil Hip ext in plank at the bar yellow loop- 15x bil Torque in single leg support - green band with punch forward Squat x 10; add pallof with green band 10x each side - cues to lift arches       PATIENT EDUCATION: Education details: Access Code: [email or web address] exercise progressions Person educated: Patient Education method: Explanation, Demonstration, Tactile cues, Verbal cues, and Handouts Education comprehension: verbalized understanding and returned demonstration   HOME EXERCISE PROGRAM: Exercises on medbridge  ASSESSMENT:  CLINICAL IMPRESSION: Pt has met most goals and is ind with advanced HEP.  Overall she is feeling 50% improved and feels like the exercises are helping things to not get worse. Pt will d/c with HEP today.   OBJECTIVE IMPAIRMENTS decreased coordination, decreased endurance, decreased ROM, decreased strength, impaired flexibility, impaired tone, postural dysfunction, and pain.   ACTIVITY LIMITATIONS lifting, bending, and standing  PARTICIPATION LIMITATIONS: cleaning and community activity  PERSONAL FACTORS 1-2 comorbidities: history of 3 vaginal deliveries  are also affecting patient's functional outcome.   REHAB POTENTIAL: Good  CLINICAL DECISION MAKING: Stable/uncomplicated  EVALUATION COMPLEXITY: Low   GOALS: Goals reviewed with patient? Yes   LONG TERM GOALS: Target date:  12/29/2021   Updated 11/09/21  Pt will be independent with advanced HEP to maintain improvements made throughout therapy  Baseline:  Goal status: MET  2.  Pt will demonstrate improved posture due to transversus abdominus activation  and 5/5 bil hip strength Baseline: 5/5 except Rt abduction 4/5 (due to pain) Goal status: NOT MET  3.  Pt will report 50% less symptoms from prolapse Baseline: maybe a little better, 50% Goal status: MET  4.  Pt will be able to void without pushing Baseline: not pushing much Goal status: MET  5.  Pt will be able to sustain kegel for 20 seconds for endurance needed during normal household activities Baseline:  Goal status: NOT MET  PLAN: PT FREQUENCY: 1x/week  PT DURATION: 12 weeks  PLANNED INTERVENTIONS: Therapeutic exercises, Therapeutic activity, Neuromuscular re-education, Balance training, Gait training, Patient/Family education, Self Care, Joint mobilization, Dry Needling, Electrical stimulation, Cryotherapy, Moist  heat, Taping, Biofeedback, and Manual therapy  PLAN FOR NEXT SESSION: d/c   Jule Ser, PT 11/30/2021, 9:35 AM  PHYSICAL THERAPY DISCHARGE SUMMARY  Visits from Start of Care: 7  Current functional level related to goals / functional outcomes: See above goals   Remaining deficits: See above details   Education / Equipment: HEP   Patient agrees to discharge. Patient goals were  most are met . Patient is being discharged due to being pleased with the current functional level. Gustavus Bryant, PT 11/30/21 11:49 AM

## 2021-11-30 ENCOUNTER — Ambulatory Visit: Payer: BC Managed Care – PPO | Attending: Nurse Practitioner | Admitting: Physical Therapy

## 2021-11-30 ENCOUNTER — Encounter: Payer: Self-pay | Admitting: Physical Therapy

## 2021-11-30 DIAGNOSIS — R279 Unspecified lack of coordination: Secondary | ICD-10-CM | POA: Insufficient documentation

## 2021-11-30 DIAGNOSIS — R293 Abnormal posture: Secondary | ICD-10-CM | POA: Insufficient documentation

## 2021-11-30 DIAGNOSIS — M6281 Muscle weakness (generalized): Secondary | ICD-10-CM | POA: Insufficient documentation

## 2021-12-21 DIAGNOSIS — E7800 Pure hypercholesterolemia, unspecified: Secondary | ICD-10-CM | POA: Diagnosis not present

## 2021-12-21 DIAGNOSIS — R002 Palpitations: Secondary | ICD-10-CM | POA: Diagnosis not present

## 2021-12-21 DIAGNOSIS — Z0000 Encounter for general adult medical examination without abnormal findings: Secondary | ICD-10-CM | POA: Diagnosis not present

## 2021-12-21 DIAGNOSIS — E039 Hypothyroidism, unspecified: Secondary | ICD-10-CM | POA: Diagnosis not present

## 2021-12-21 DIAGNOSIS — E559 Vitamin D deficiency, unspecified: Secondary | ICD-10-CM | POA: Diagnosis not present

## 2022-01-03 ENCOUNTER — Other Ambulatory Visit: Payer: Self-pay

## 2022-01-03 ENCOUNTER — Other Ambulatory Visit: Payer: Self-pay | Admitting: Family Medicine

## 2022-01-03 DIAGNOSIS — Z1231 Encounter for screening mammogram for malignant neoplasm of breast: Secondary | ICD-10-CM

## 2022-01-06 ENCOUNTER — Encounter: Payer: Self-pay | Admitting: *Deleted

## 2022-01-06 NOTE — Progress Notes (Signed)
Patient is no longer taken Prolia. Patient is taken fosamax

## 2022-01-11 DIAGNOSIS — I493 Ventricular premature depolarization: Secondary | ICD-10-CM | POA: Diagnosis not present

## 2022-01-11 DIAGNOSIS — R002 Palpitations: Secondary | ICD-10-CM | POA: Diagnosis not present

## 2022-01-18 ENCOUNTER — Ambulatory Visit: Payer: BC Managed Care – PPO | Attending: Internal Medicine

## 2022-01-18 ENCOUNTER — Encounter: Payer: Self-pay | Admitting: Internal Medicine

## 2022-01-18 ENCOUNTER — Ambulatory Visit: Payer: BC Managed Care – PPO | Attending: Internal Medicine | Admitting: Internal Medicine

## 2022-01-18 VITALS — BP 117/80 | HR 69 | Ht 64.0 in | Wt 124.6 lb

## 2022-01-18 DIAGNOSIS — R002 Palpitations: Secondary | ICD-10-CM | POA: Diagnosis not present

## 2022-01-18 DIAGNOSIS — I493 Ventricular premature depolarization: Secondary | ICD-10-CM | POA: Diagnosis not present

## 2022-01-18 MED ORDER — METOPROLOL SUCCINATE ER 25 MG PO TB24: 25.0000 mg | ORAL_TABLET | Freq: Every day | ORAL | 1 refills | Status: DC

## 2022-01-18 NOTE — Progress Notes (Unsigned)
Enrolled patient for a 14 day Zio XT  monitor to be mailed to patients home  °

## 2022-01-18 NOTE — Patient Instructions (Addendum)
Medication Instructions:  Start Metoprolol XL ( Take 1 Tablet Daily). *If you need a refill on your cardiac medications before your next appointment, please call your pharmacy*   Lab Work: No labs If you have labs (blood work) drawn today and your tests are completely normal, you will receive your results only by: MyChart Message (if you have MyChart) OR A paper copy in the mail If you have any lab test that is abnormal or we need to change your treatment, we will call you to review the results.   Testing/Procedures: [address], [address]. Your physician has requested that you have an echocardiogram. Echocardiography is a painless test that uses sound waves to create images of your heart. It provides your doctor with information about the size and shape of your heart and how well your heart's chambers and valves are working. This procedure takes approximately one hour. There are no restrictions for this procedure. Please do NOT wear cologne, perfume, aftershave, or lotions (deodorant is allowed). Please arrive 15 minutes prior to your appointment time.   ZIO XT- Long Term Monitor Instructions  Your physician has requested you wear a ZIO patch monitor for 14 days.  This is a single patch monitor. [employer] supplies one patch monitor per enrollment. Additional stickers are not available. Please do not apply patch if you will be having a Nuclear Stress Test,  Echocardiogram, Cardiac CT, MRI, or Chest Xray during the period you would be wearing the  monitor. The patch cannot be worn during these tests. You cannot remove and re-apply the  ZIO XT patch monitor.  Your ZIO patch monitor will be mailed 3 day USPS to your address on file. It may take 3-5 days  to receive your monitor after you have been enrolled.  Once you have received your monitor, please review the enclosed instructions. Your monitor  has already been registered assigning a specific monitor serial # to  you.  Billing and Patient Assistance Program Information  We have supplied [employer] with any of your insurance information on file for billing purposes. [employer] offers a sliding scale Patient Assistance Program for patients that do not have  insurance, or whose insurance does not completely cover the cost of the ZIO monitor.  You must apply for the Patient Assistance Program to qualify for this discounted rate.  To apply, please call [employer] at [phone], select option 4, select option 2, ask to apply for  Patient Assistance Program. Meredeth Ide will ask your household income, and how many people  are in your household. They will quote your out-of-pocket cost based on that information.  [employer] will also be able to set up a 11-month, interest-free payment plan if needed.  Applying the monitor   Shave hair from upper left chest.  Hold abrader disc by orange tab. Rub abrader in 40 strokes over the upper left chest as  indicated in your monitor instructions.  Clean area with 4 enclosed alcohol pads. Let dry.  Apply patch as indicated in monitor instructions. Patch will be placed under collarbone on left  side of chest with arrow pointing upward.  Rub patch adhesive wings for 2 minutes. Remove white label marked "1". Remove the white  label marked "2". Rub patch adhesive wings for 2 additional minutes.  While looking in a mirror, press and release button in center of patch. A small green light will  flash 3-4 times. This will be your only indicator that the monitor has been turned on.  Do  not shower for the first 24 hours. You may shower after the first 24 hours.  Press the button if you feel a symptom. You will hear a small click. Record Date, Time and  Symptom in the Patient Logbook.  When you are ready to remove the patch, follow instructions on the last 2 pages of Patient  Logbook. Stick patch monitor onto the last page of Patient Logbook.  Place Patient Logbook in the blue and white box.  Use locking tab on box and tape box closed  securely. The blue and white box has prepaid postage on it. Please place it in the mailbox as  soon as possible. Your physician should have your test results approximately 7 days after the  monitor has been mailed back to [facility].  Call [facility] Customer Care at [phone] if you have questions regarding  your ZIO XT patch monitor. Call them immediately if you see an orange light blinking on your  monitor.  If your monitor falls off in less than 4 days, contact our Monitor department at [phone].  If your monitor becomes loose or falls off after 4 days call [employer] at [phone] for  suggestions on securing your monitor   Follow-Up: At [facility], you and your health needs are our priority.  As part of our continuing mission to provide you with exceptional heart care, we have created designated Provider Care Teams.  These Care Teams include your primary Cardiologist (physician) and Advanced Practice Providers (APPs -  Physician Assistants and Nurse Practitioners) who all work together to provide you with the care you need, when you need it.  We recommend signing up for the patient portal called "MyChart".  Sign up information is provided on this After Visit Summary.  MyChart is used to connect with patients for Virtual Visits (Telemedicine).  Patients are able to view lab/test results, encounter notes, upcoming appointments, etc.  Non-urgent messages can be sent to your provider as well.   To learn more about what you can do with MyChart, go to [email or web address].    Your next appointment:   3 month(s)  The format for your next appointment:   In Person  Provider:   Maisie Fus, MD

## 2022-01-18 NOTE — Progress Notes (Signed)
Cardiology Office Note:    Date:  01/18/2022   ID:  Dawn Macdonald, DOB 20-Jul-1965, MRN [id number]  PCP:  Aletha Halim PA-C   [facility] Providers Cardiologist:  None     Referring MD: Aletha Halim., PA-C   No chief complaint on file. Palpitations  History of Present Illness:    Dawn Macdonald is a 56 y.o. female with a hx of depression, hypothyroid (TSH 2.08), referral for palpitations. She has known vertigo, meclizine not helping.  No family hx of arrhythmia. No family hx of CAD. She drinks caffeine, but stopped. Occasional etoh. She hydrates well. 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  She notes since then, her palpitations have increased. An EKG prior showed  sinus rhythm and PVCs.  She also notes high LDL 204. Had statin intolerance prior. CAC was zero when measured at [facility].  Past Medical History:  Diagnosis Date   Depression    Hypothyroid    Sciatica    Varicose veins     Past Surgical History:  Procedure Laterality Date   implant tooth     TONSILLECTOMY     VARICOSE VEIN SURGERY  09/1997    Current Medications: Current Meds  Medication Sig   alendronate (FOSAMAX) 70 MG tablet TAKE 1 TABLET EVERY 7 DAYS WITH A FULL GLASS OF WATER ON AN EMPTY STOMACH   CALCIUM PO Take 1 tablet by mouth daily.   Cholecalciferol (VITAMIN D PO) Take 1 tablet by mouth daily.   latanoprost (XALATAN) 0.005 % ophthalmic solution as directed.   levothyroxine (SYNTHROID) 112 MCG tablet TAKE 1 TABLET DAILY   metoprolol succinate (TOPROL XL) 25 MG 24 hr tablet Take 1 tablet (25 mg total) by mouth daily.     Allergies:   Patient has no known allergies.   Social History   Socioeconomic History   Marital status: Married    Spouse name: Not on file   Number of children: Not on file   Years of education: Not on file   Highest education level: Not on file  Occupational History   Not on file  Tobacco Use   Smoking status:  Former    Types: Cigarettes    Quit date: 11/02/1990    Years since quitting: 31.2   Smokeless tobacco: Never  Vaping Use   Vaping Use: Never used  Substance and Sexual Activity   Alcohol use: Yes    Comment: OCCASIONALLY   Drug use: No   Sexual activity: Yes    Birth control/protection: Post-menopausal  Other Topics Concern   Not on file  Social History Narrative   Not on file   Social Determinants of Health   Financial Resource Strain: Not on file  Food Insecurity: Not on file  Transportation Needs: Not on file  Physical Activity: Not on file  Stress: Not on file  Social Connections: Not on file     Family History: The patient's family history includes Cancer in her father.  ROS:   Please see the history of present illness.     All other systems reviewed and are negative.  EKGs/Labs/Other Studies Reviewed:    The following studies were reviewed today:   EKG:  EKG is  ordered today.  The ekg ordered today demonstrates   01/18/2022- NSR  Recent Labs: No results found for requested labs within last 365 days.   Recent Lipid Panel    Component Value Date/Time   CHOL 308 (H)  12/27/2016 1107   TRIG 58 12/27/2016 1107   HDL 103 12/27/2016 1107   CHOLHDL 3.0 12/27/2016 1107   VLDL 15 12/22/2015 1026   LDLCALC 190 (H) 12/27/2016 1107     Risk Assessment/Calculations:         Physical Exam:    VS:    Vitals:   01/18/22 1539  BP: 117/80  Pulse: 69  SpO2: 100%     Wt Readings from Last 3 Encounters:  01/18/22 124 lb 9.6 oz (56.5 kg)  01/19/21 130 lb (59 kg)  01/14/20 126 lb (57.2 kg)     GEN:  Well nourished, well developed in no acute distress HEENT: Normal NECK: No JVD; No carotid bruits LYMPHATICS: No lymphadenopathy CARDIAC: RRR, no murmurs, rubs, gallops RESPIRATORY:  Clear to auscultation without rales, wheezing or rhonchi  ABDOMEN: Soft, non-tender, non-distended MUSCULOSKELETAL:  No edema; No deformity  SKIN: Warm and  dry NEUROLOGIC:  Alert and oriented x 3 PSYCHIATRIC:  Normal affect   ASSESSMENT:    Palpitations: She does not have high risk features including syncope c/f arrhythmia , family hx of SCD, or abnormalities on her EKG. Likely PVC related. Noted recent COVID infection. We discussed scarring related to inflammation post COVID is possible. Will get a cardiac monitor and TTE. Will start metop 25 mg XL daily.   Hyperlipidemia: She had a prior CAC which was  zero. LDL 204, noted statin intolerance.  Can consider leqvio, will discuss on her FU  PLAN:    In order of problems listed above:  14 day ziopatch Start metop 25 mg XL daily TTE Follow up 3 months      Medication Adjustments/Labs and Tests Ordered: Current medicines are reviewed at length with the patient today.  Concerns regarding medicines are outlined above.  Orders Placed This Encounter  Procedures   LONG TERM MONITOR (3-14 DAYS)   EKG 12-Lead   ECHOCARDIOGRAM COMPLETE   Meds ordered this encounter  Medications   metoprolol succinate (TOPROL XL) 25 MG 24 hr tablet    Sig: Take 1 tablet (25 mg total) by mouth daily.    Dispense:  30 tablet    Refill:  1    Patient Instructions  Medication Instructions:  Start Metoprolol XL ( Take 1 Tablet Daily). *If you need a refill on your cardiac medications before your next appointment, please call your pharmacy*   Lab Work: No labs If you have labs (blood work) drawn today and your tests are completely normal, you will receive your results only by: MyChart Message (if you have MyChart) OR A paper copy in the mail If you have any lab test that is abnormal or we need to change your treatment, we will call you to review the results.   Testing/Procedures: [address]. Your physician has requested that you have an echocardiogram. Echocardiography is a painless test that uses sound waves to create images of your heart. It provides your doctor with  information about the size and shape of your heart and how well your heart's chambers and valves are working. This procedure takes approximately one hour. There are no restrictions for this procedure. Please do NOT wear cologne, perfume, aftershave, or lotions (deodorant is allowed). Please arrive 15 minutes prior to your appointment time.   ZIO XT- Long Term Monitor Instructions  Your physician has requested you wear a ZIO patch monitor for 14 days.  This is a single patch monitor. [employer] supplies one patch monitor per enrollment.  Additional stickers are not available. Please do not apply patch if you will be having a Nuclear Stress Test,  Echocardiogram, Cardiac CT, MRI, or Chest Xray during the period you would be wearing the  monitor. The patch cannot be worn during these tests. You cannot remove and re-apply the  ZIO XT patch monitor.  Your ZIO patch monitor will be mailed 3 day USPS to your address on file. It may take 3-5 days  to receive your monitor after you have been enrolled.  Once you have received your monitor, please review the enclosed instructions. Your monitor  has already been registered assigning a specific monitor serial # to you.  Billing and Patient Assistance Program Information  We have supplied [employer] with any of your insurance information on file for billing purposes. [employer] offers a sliding scale Patient Assistance Program for patients that do not have  insurance, or whose insurance does not completely cover the cost of the ZIO monitor.  You must apply for the Patient Assistance Program to qualify for this discounted rate.  To apply, please call [employer] at [phone], select option 4, select option 2, ask to apply for  Patient Assistance Program. Theodore Demark will ask your household income, and how many people  are in your household. They will quote your out-of-pocket cost based on that information.  [employer] will also be able to set up a 90-month, interest-free  payment plan if needed.  Applying the monitor   Shave hair from upper left chest.  Hold abrader disc by orange tab. Rub abrader in 40 strokes over the upper left chest as  indicated in your monitor instructions.  Clean area with 4 enclosed alcohol pads. Let dry.  Apply patch as indicated in monitor instructions. Patch will be placed under collarbone on left  side of chest with arrow pointing upward.  Rub patch adhesive wings for 2 minutes. Remove white label marked "1". Remove the white  label marked "2". Rub patch adhesive wings for 2 additional minutes.  While looking in a mirror, press and release button in center of patch. A small green light will  flash 3-4 times. This will be your only indicator that the monitor has been turned on.  Do not shower for the first 24 hours. You may shower after the first 24 hours.  Press the button if you feel a symptom. You will hear a small click. Record Date, Time and  Symptom in the Patient Logbook.  When you are ready to remove the patch, follow instructions on the last 2 pages of Patient  Logbook. Stick patch monitor onto the last page of Patient Logbook.  Place Patient Logbook in the blue and white box. Use locking tab on box and tape box closed  securely. The blue and white box has prepaid postage on it. Please place it in the mailbox as  soon as possible. Your physician should have your test results approximately 7 days after the  monitor has been mailed back to [facility].  Call [place] at [phone] if you have questions regarding  your ZIO XT patch monitor. Call them immediately if you see an orange light blinking on your  monitor.  If your monitor falls off in less than 4 days, contact our Monitor department at [phone].  If your monitor becomes loose or falls off after 4 days call [employer] at [phone] for  suggestions on securing your monitor   Follow-Up: At [facility], you and your health  needs are  our priority.  As part of our continuing mission to provide you with exceptional heart care, we have created designated Provider Care Teams.  These Care Teams include your primary Cardiologist (physician) and Advanced Practice Providers (APPs -  Physician Assistants and Nurse Practitioners) who all work together to provide you with the care you need, when you need it.  We recommend signing up for the patient portal called "MyChart".  Sign up information is provided on this After Visit Summary.  MyChart is used to connect with patients for Virtual Visits (Telemedicine).  Patients are able to view lab/test results, encounter notes, upcoming appointments, etc.  Non-urgent messages can be sent to your provider as well.   To learn more about what you can do with MyChart, go to [email or web address].    Your next appointment:   3 month(s)  The format for your next appointment:   In Person  Provider:   Janina Mayo, MD     Signed, Janina Mayo, MD  01/18/2022 4:43 PM    [place]

## 2022-01-21 DIAGNOSIS — I493 Ventricular premature depolarization: Secondary | ICD-10-CM | POA: Diagnosis not present

## 2022-01-21 DIAGNOSIS — R002 Palpitations: Secondary | ICD-10-CM | POA: Diagnosis not present

## 2022-01-25 ENCOUNTER — Ambulatory Visit: Payer: BC Managed Care – PPO | Admitting: Nurse Practitioner

## 2022-02-01 ENCOUNTER — Ambulatory Visit (INDEPENDENT_AMBULATORY_CARE_PROVIDER_SITE_OTHER): Payer: BC Managed Care – PPO | Admitting: Nurse Practitioner

## 2022-02-01 ENCOUNTER — Encounter: Payer: Self-pay | Admitting: Nurse Practitioner

## 2022-02-01 VITALS — BP 112/64 | HR 75 | Ht 64.0 in | Wt 123.8 lb

## 2022-02-01 DIAGNOSIS — M810 Age-related osteoporosis without current pathological fracture: Secondary | ICD-10-CM

## 2022-02-01 DIAGNOSIS — Z01419 Encounter for gynecological examination (general) (routine) without abnormal findings: Secondary | ICD-10-CM

## 2022-02-01 DIAGNOSIS — Z780 Asymptomatic menopausal state: Secondary | ICD-10-CM | POA: Diagnosis not present

## 2022-02-01 MED ORDER — ALENDRONATE SODIUM 70 MG PO TABS: 70.0000 mg | ORAL_TABLET | ORAL | 3 refills | Status: DC

## 2022-02-01 NOTE — Progress Notes (Signed)
   Dawn Macdonald [birth year] [id number]   History:  57 y.o. G 3P3 presents for annual exam. Postmenopausal - no HRT, no bleeding. Abnormal pap greater than 30 years ago. Normal mammogram history. Osteoporosis - on Fosamax, tolerating well. Hypothyroidism, HLD managed by PCP. Currently wearing ZIO patch monitor for evaluation of heart palpitations.   Gynecologic History No LMP recorded. Patient is postmenopausal.   Contraception: post menopausal status Sexually active: Yes  Health maintenance Last Pap: 01/02/2018. Results were: Normal, 5-year repeat Last mammogram: 03/16/2021. Results were: Normal Last colonoscopy: 2019. Results were: Normal, 10-year recall Last Dexa: 03/16/2020. Results were: T-score -2.8 at spine (3.5 in 2019)  Past medical history, past surgical history, family history and social history were all reviewed and documented in the EPIC chart. Married. 2 sons, both married and live in [place]. Lexicographer.   ROS:  A ROS was performed and pertinent positives and negatives are included.  Exam:  Vitals:   02/01/22 0838  BP: 112/64  Pulse: 75  SpO2: 100%  Weight: 123 lb 12.8 oz (56.2 kg)  Height: 5\' 4"  (1.626 m)     Body mass index is 21.25 kg/m.  General appearance:  Normal Thyroid:  Symmetrical, normal in size, without palpable masses or nodularity. Respiratory  Auscultation:  Clear without wheezing or rhonchi Cardiovascular  Auscultation:  Regular rate, without rubs, murmurs or gallops  Edema/varicosities:  Not grossly evident Abdominal  Soft,nontender, without masses, guarding or rebound.  Liver/spleen:  No organomegaly noted  Hernia:  None appreciated  Skin  Inspection:  Grossly normal   Breasts: Examined lying and sitting.   Right: Without masses, retractions, discharge or axillary adenopathy.   Left: Without masses, retractions, discharge or axillary adenopathy. Gentitourinary   Inguinal/mons:  Normal without inguinal adenopathy  External  genitalia:  Normal  BUS/Urethra/Skene's glands:  Normal  Vagina:  Atrophic changes, 1+ rectocele, vaginal prolapse  Cervix:  Normal  Uterus:  Normal in size, shape and contour.  Midline and mobile  Adnexa/parametria:     Rt: Without masses or tenderness.   Lt: Without masses or tenderness.  Anus and perineum: Non-bleeding hemorrhoids  Digital rectal exam: Normal sphincter tone without palpated masses or tenderness  Patient informed chaperone available to be present for breast and pelvic exam. Patient has requested no chaperone to be present. Patient has been advised what will be completed during breast and pelvic exam.   Assessment/Plan:  57 y.o. G3P3 for annual exam.   Well female exam with routine gynecological exam - Education provided on SBEs, importance of preventative screenings, current guidelines, high calcium diet, regular exercise, and multivitamin daily. Labs with PCP.   Postmenopausal - no HRT, no bleeding.   Age-related osteoporosis without current pathological fracture - Plan: DG Bone Density, alendronate (FOSAMAX) 70 MG tablet weekly. T-score -2.8 in spine February 2022 (3.5 in 2019). On Fosamax, tolerating well. Started in 03/2017, stopped 12/2019, did one dose of Prolia 05/2020 but switched back to Fosamax due to cost. Will repeat DXA in February. Continue Vitamin D + Calcium and regular exercise.   Screening for cervical cancer - Abnormal pap greater than 30 years ago. Will repeat at 5-year interval per guidelines.   Screening for breast cancer - Normal mammogram history. Continue annual screenings. Normal breast exam today.   Screening for colon cancer - Colonoscopy in 2019. Will repeat at 10-year interval per GI's recommendation.   Follow up in 1 year for annual.      Tamela Gammon [facility], 8:53 AM 02/01/2022

## 2022-02-15 ENCOUNTER — Ambulatory Visit (HOSPITAL_COMMUNITY): Payer: BC Managed Care – PPO | Attending: Cardiology

## 2022-02-15 DIAGNOSIS — I493 Ventricular premature depolarization: Secondary | ICD-10-CM | POA: Diagnosis not present

## 2022-02-15 DIAGNOSIS — R002 Palpitations: Secondary | ICD-10-CM | POA: Insufficient documentation

## 2022-02-15 LAB — ECHOCARDIOGRAM COMPLETE
Area-P 1/2: 3.21 cm2
S' Lateral: 2.7 cm

## 2022-02-16 ENCOUNTER — Encounter: Payer: Self-pay | Admitting: Internal Medicine

## 2022-02-17 MED ORDER — METOPROLOL SUCCINATE ER 25 MG PO TB24: 25.0000 mg | ORAL_TABLET | Freq: Every day | ORAL | 3 refills | Status: DC

## 2022-03-08 DIAGNOSIS — L821 Other seborrheic keratosis: Secondary | ICD-10-CM | POA: Diagnosis not present

## 2022-03-08 DIAGNOSIS — D225 Melanocytic nevi of trunk: Secondary | ICD-10-CM | POA: Diagnosis not present

## 2022-03-08 DIAGNOSIS — L814 Other melanin hyperpigmentation: Secondary | ICD-10-CM | POA: Diagnosis not present

## 2022-03-13 ENCOUNTER — Encounter: Payer: Self-pay | Admitting: Internal Medicine

## 2022-03-13 MED ORDER — METOPROLOL SUCCINATE ER 25 MG PO TB24: 25.0000 mg | ORAL_TABLET | Freq: Every day | ORAL | 0 refills | Status: DC

## 2022-03-21 ENCOUNTER — Ambulatory Visit (INDEPENDENT_AMBULATORY_CARE_PROVIDER_SITE_OTHER): Payer: BC Managed Care – PPO

## 2022-03-21 ENCOUNTER — Other Ambulatory Visit: Payer: Self-pay | Admitting: Nurse Practitioner

## 2022-03-21 DIAGNOSIS — M810 Age-related osteoporosis without current pathological fracture: Secondary | ICD-10-CM

## 2022-03-21 DIAGNOSIS — Z13820 Encounter for screening for osteoporosis: Secondary | ICD-10-CM | POA: Diagnosis not present

## 2022-03-21 DIAGNOSIS — Z780 Asymptomatic menopausal state: Secondary | ICD-10-CM

## 2022-03-21 DIAGNOSIS — Z01419 Encounter for gynecological examination (general) (routine) without abnormal findings: Secondary | ICD-10-CM

## 2022-03-22 ENCOUNTER — Ambulatory Visit
Admission: RE | Admit: 2022-03-22 | Discharge: 2022-03-22 | Disposition: A | Discharge status: Home / Self Care | Payer: BC Managed Care – PPO | Source: Ambulatory Visit | Attending: Family Medicine | Admitting: Family Medicine

## 2022-03-22 DIAGNOSIS — Z1231 Encounter for screening mammogram for malignant neoplasm of breast: Secondary | ICD-10-CM

## 2022-03-28 DIAGNOSIS — N819 Female genital prolapse, unspecified: Secondary | ICD-10-CM | POA: Diagnosis not present

## 2022-03-29 DIAGNOSIS — H35373 Puckering of macula, bilateral: Secondary | ICD-10-CM | POA: Diagnosis not present

## 2022-03-29 DIAGNOSIS — H2513 Age-related nuclear cataract, bilateral: Secondary | ICD-10-CM | POA: Diagnosis not present

## 2022-03-29 DIAGNOSIS — H401231 Low-tension glaucoma, bilateral, mild stage: Secondary | ICD-10-CM | POA: Diagnosis not present

## 2022-03-29 DIAGNOSIS — H25013 Cortical age-related cataract, bilateral: Secondary | ICD-10-CM | POA: Diagnosis not present

## 2022-03-29 DIAGNOSIS — H524 Presbyopia: Secondary | ICD-10-CM | POA: Diagnosis not present

## 2022-04-18 DIAGNOSIS — K625 Hemorrhage of anus and rectum: Secondary | ICD-10-CM | POA: Diagnosis not present

## 2022-04-19 ENCOUNTER — Ambulatory Visit: Payer: BC Managed Care – PPO | Admitting: Internal Medicine

## 2022-05-10 ENCOUNTER — Encounter: Payer: Self-pay | Admitting: Internal Medicine

## 2022-05-10 ENCOUNTER — Ambulatory Visit: Payer: BC Managed Care – PPO | Attending: Internal Medicine | Admitting: Internal Medicine

## 2022-05-10 VITALS — BP 94/58 | HR 66 | Ht 64.0 in | Wt 128.6 lb

## 2022-05-10 DIAGNOSIS — R002 Palpitations: Secondary | ICD-10-CM

## 2022-05-10 NOTE — Patient Instructions (Signed)
Medication Instructions:  No Changes In Medications at this time.  *If you need a refill on your cardiac medications before your next appointment, please call your pharmacy*  Lab Work: None Ordered At This Time.  If you have labs (blood work) drawn today and your tests are completely normal, you will receive your results only by: MyChart Message (if you have MyChart) OR A paper copy in the mail If you have any lab test that is abnormal or we need to change your treatment, we will call you to review the results.  Testing/Procedures: None Ordered At This Time.   Follow-Up: At [facility], you and your health needs are our priority.  As part of our continuing mission to provide you with exceptional heart care, we have created designated Provider Care Teams.  These Care Teams include your primary Cardiologist (physician) and Advanced Practice Providers (APPs -  Physician Assistants and Nurse Practitioners) who all work together to provide you with the care you need, when you need it.  Your next appointment:   1 year(s)  Provider:   Dr.  Branch  

## 2022-05-10 NOTE — Progress Notes (Signed)
Cardiology Office Note:    Date:  05/10/2022   ID:  Dawn Macdonald, DOB 06/14/1965, MRN [id number]  PCP:  Richmond Campbell PA-C   [facility] Providers Cardiologist:  None     Referring MD: Richmond Campbell., PA-C   No chief complaint on file. Palpitations  History of Present Illness:    Dawn Macdonald is a 57 y.o. female with a hx of depression, hypothyroid (TSH 2.08), referral for palpitations. She has known vertigo, meclizine not helping.  No family hx of arrhythmia. No family hx of CAD. She drinks caffeine, but stopped. Occasional etoh. She hydrates well. 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  She notes since then, her palpitations have increased. An EKG prior showed  sinus rhythm and PVCs.  She also notes high LDL 204, TC 310. Had statin intolerance prior. CAC was zero when measured at [facility].   Interim hx 05/10/2022 She has some palpitations, symptoms improved. She tried simvastatin and crestor. Wants to hold off on medications. No chest pain  Past Medical History:  Diagnosis Date   Depression    Hypothyroid    Sciatica    Varicose veins     Past Surgical History:  Procedure Laterality Date   implant tooth     TONSILLECTOMY     VARICOSE VEIN SURGERY  09/1997    Current Medications: No outpatient medications have been marked as taking for the 05/10/22 encounter (Appointment) with Maisie Fus, MD.     Allergies:   Patient has no known allergies.   Social History   Socioeconomic History   Marital status: Married    Spouse name: Not on file   Number of children: Not on file   Years of education: Not on file   Highest education level: Not on file  Occupational History   Not on file  Tobacco Use   Smoking status: Former    Types: Cigarettes    Quit date: 11/02/1990    Years since quitting: 31.5   Smokeless tobacco: Never  Vaping Use   Vaping Use: Never used  Substance and Sexual Activity   Alcohol use:  Yes    Comment: OCCASIONALLY   Drug use: No   Sexual activity: Yes    Birth control/protection: Post-menopausal  Other Topics Concern   Not on file  Social History Narrative   Not on file   Social Determinants of Health   Financial Resource Strain: Not on file  Food Insecurity: Not on file  Transportation Needs: Not on file  Physical Activity: Not on file  Stress: Not on file  Social Connections: Not on file     Family History: The patient's family history includes Cancer in her father.  ROS:   Please see the history of present illness.     All other systems reviewed and are negative.  EKGs/Labs/Other Studies Reviewed:    The following studies were reviewed today:   EKG:  EKG is  ordered today.  The ekg ordered today demonstrates   01/18/2022- NSR  Recent Labs: No results found for requested labs within last 365 days.   Recent Lipid Panel    Component Value Date/Time   CHOL 308 (H) 12/27/2016 1107   TRIG 58 12/27/2016 1107   HDL 103 12/27/2016 1107   CHOLHDL 3.0 12/27/2016 1107   VLDL 15 12/22/2015 1026   LDLCALC 190 (H) 12/27/2016 1107     Risk Assessment/Calculations:  Physical Exam:    VS:   Vitals:   05/10/22 1316  BP: (!) 94/58  Pulse: 66  SpO2: 97%     Wt Readings from Last 3 Encounters:  02/01/22 123 lb 12.8 oz (56.2 kg)  01/18/22 124 lb 9.6 oz (56.5 kg)  01/19/21 130 lb (59 kg)     GEN:  Well nourished, well developed in no acute distress HEENT: Normal NECK: No JVD; No carotid bruits LYMPHATICS: No lymphadenopathy CARDIAC: RRR, no murmurs, rubs, gallops RESPIRATORY:  Clear to auscultation without rales, wheezing or rhonchi  ABDOMEN: Soft, non-tender, non-distended MUSCULOSKELETAL:  No edema; No deformity  SKIN: Warm and dry NEUROLOGIC:  Alert and oriented x 3 PSYCHIATRIC:  Normal affect   ASSESSMENT:    Palpitations: She does not have high risk features including syncope c/f arrhythmia , family hx of SCD, or  abnormalities on her EKG.  Noted recent COVID infection. We discussed scarring related to inflammation post COVID is possible. Ziopatch showed brief SVT and some PVCs. Her echo was normal. Started metop 25 mg XL daily. Can continue this  Hyperlipidemia: She had a prior CAC of zero. LDL 204, noted statin intolerance to simvastatin and crestor with myalgias. Wants to hold off on starting zetia or referral for PCSK9i. Happy to help if this changes  PLAN:    In order of problems listed above:  Follow up 12 months      Medication Adjustments/Labs and Tests Ordered: Current medicines are reviewed at length with the patient today.  Concerns regarding medicines are outlined above.  No orders of the defined types were placed in this encounter.  No orders of the defined types were placed in this encounter.   There are no Patient Instructions on file for this visit.   Signed, Maisie Fus, MD  05/10/2022 12:47 PM    [facility]

## 2022-05-26 ENCOUNTER — Other Ambulatory Visit: Payer: Self-pay

## 2022-05-26 ENCOUNTER — Encounter: Payer: Self-pay | Admitting: Internal Medicine

## 2022-05-26 DIAGNOSIS — K625 Hemorrhage of anus and rectum: Secondary | ICD-10-CM | POA: Diagnosis not present

## 2022-05-26 MED ORDER — METOPROLOL SUCCINATE ER 25 MG PO TB24: 25.0000 mg | ORAL_TABLET | Freq: Every day | ORAL | 3 refills | Status: DC

## 2022-06-28 DIAGNOSIS — H401211 Low-tension glaucoma, right eye, mild stage: Secondary | ICD-10-CM | POA: Diagnosis not present

## 2022-06-28 DIAGNOSIS — H25013 Cortical age-related cataract, bilateral: Secondary | ICD-10-CM | POA: Diagnosis not present

## 2022-06-28 DIAGNOSIS — H401231 Low-tension glaucoma, bilateral, mild stage: Secondary | ICD-10-CM | POA: Diagnosis not present

## 2022-06-28 DIAGNOSIS — H2513 Age-related nuclear cataract, bilateral: Secondary | ICD-10-CM | POA: Diagnosis not present

## 2022-06-30 DIAGNOSIS — K625 Hemorrhage of anus and rectum: Secondary | ICD-10-CM | POA: Diagnosis not present

## 2022-07-19 DIAGNOSIS — H401121 Primary open-angle glaucoma, left eye, mild stage: Secondary | ICD-10-CM | POA: Diagnosis not present

## 2022-07-21 DIAGNOSIS — K625 Hemorrhage of anus and rectum: Secondary | ICD-10-CM | POA: Diagnosis not present

## 2022-07-26 DIAGNOSIS — E7800 Pure hypercholesterolemia, unspecified: Secondary | ICD-10-CM | POA: Diagnosis not present

## 2022-09-20 DIAGNOSIS — H401231 Low-tension glaucoma, bilateral, mild stage: Secondary | ICD-10-CM | POA: Diagnosis not present

## 2022-10-26 DIAGNOSIS — K219 Gastro-esophageal reflux disease without esophagitis: Secondary | ICD-10-CM | POA: Diagnosis not present

## 2022-10-26 DIAGNOSIS — R1013 Epigastric pain: Secondary | ICD-10-CM | POA: Diagnosis not present

## 2022-10-26 DIAGNOSIS — K625 Hemorrhage of anus and rectum: Secondary | ICD-10-CM | POA: Diagnosis not present

## 2022-11-16 DIAGNOSIS — H35373 Puckering of macula, bilateral: Secondary | ICD-10-CM | POA: Diagnosis not present

## 2022-11-16 DIAGNOSIS — H43822 Vitreomacular adhesion, left eye: Secondary | ICD-10-CM | POA: Diagnosis not present

## 2022-11-16 DIAGNOSIS — H401234 Low-tension glaucoma, bilateral, indeterminate stage: Secondary | ICD-10-CM | POA: Diagnosis not present

## 2022-11-16 DIAGNOSIS — H25813 Combined forms of age-related cataract, bilateral: Secondary | ICD-10-CM | POA: Diagnosis not present

## 2022-12-13 DIAGNOSIS — K2950 Unspecified chronic gastritis without bleeding: Secondary | ICD-10-CM | POA: Diagnosis not present

## 2022-12-13 DIAGNOSIS — K3189 Other diseases of stomach and duodenum: Secondary | ICD-10-CM | POA: Diagnosis not present

## 2022-12-13 DIAGNOSIS — R1013 Epigastric pain: Secondary | ICD-10-CM | POA: Diagnosis not present

## 2022-12-27 DIAGNOSIS — E7800 Pure hypercholesterolemia, unspecified: Secondary | ICD-10-CM | POA: Diagnosis not present

## 2022-12-27 DIAGNOSIS — Z0000 Encounter for general adult medical examination without abnormal findings: Secondary | ICD-10-CM | POA: Diagnosis not present

## 2022-12-27 DIAGNOSIS — E039 Hypothyroidism, unspecified: Secondary | ICD-10-CM | POA: Diagnosis not present

## 2022-12-27 DIAGNOSIS — E559 Vitamin D deficiency, unspecified: Secondary | ICD-10-CM | POA: Diagnosis not present

## 2023-01-03 DIAGNOSIS — Z0000 Encounter for general adult medical examination without abnormal findings: Secondary | ICD-10-CM | POA: Diagnosis not present

## 2023-01-03 DIAGNOSIS — M810 Age-related osteoporosis without current pathological fracture: Secondary | ICD-10-CM | POA: Diagnosis not present

## 2023-01-03 DIAGNOSIS — E039 Hypothyroidism, unspecified: Secondary | ICD-10-CM | POA: Diagnosis not present

## 2023-01-08 DIAGNOSIS — H401122 Primary open-angle glaucoma, left eye, moderate stage: Secondary | ICD-10-CM | POA: Diagnosis not present

## 2023-01-08 DIAGNOSIS — H2512 Age-related nuclear cataract, left eye: Secondary | ICD-10-CM | POA: Diagnosis not present

## 2023-01-10 ENCOUNTER — Other Ambulatory Visit: Payer: Self-pay | Admitting: Nurse Practitioner

## 2023-01-10 DIAGNOSIS — Z87891 Personal history of nicotine dependence: Secondary | ICD-10-CM | POA: Diagnosis not present

## 2023-01-10 DIAGNOSIS — H401112 Primary open-angle glaucoma, right eye, moderate stage: Secondary | ICD-10-CM | POA: Diagnosis not present

## 2023-01-10 DIAGNOSIS — M810 Age-related osteoporosis without current pathological fracture: Secondary | ICD-10-CM

## 2023-01-10 DIAGNOSIS — H25011 Cortical age-related cataract, right eye: Secondary | ICD-10-CM | POA: Diagnosis not present

## 2023-01-10 DIAGNOSIS — Z79890 Hormone replacement therapy (postmenopausal): Secondary | ICD-10-CM | POA: Diagnosis not present

## 2023-01-10 DIAGNOSIS — H401211 Low-tension glaucoma, right eye, mild stage: Secondary | ICD-10-CM | POA: Diagnosis not present

## 2023-01-10 DIAGNOSIS — H2511 Age-related nuclear cataract, right eye: Secondary | ICD-10-CM | POA: Diagnosis not present

## 2023-01-10 NOTE — Telephone Encounter (Signed)
Med refill request: Fosamax  Last AEX: 02/01/2022-TW Next AEX: 02/07/2023 Last DXA: 03/21/2022-osteoporosis (T-score -2.9) Refill authorized: rx pend.

## 2023-02-07 ENCOUNTER — Other Ambulatory Visit (HOSPITAL_COMMUNITY)
Admission: RE | Admit: 2023-02-07 | Discharge: 2023-02-07 | Disposition: A | Discharge status: Home / Self Care | Payer: BC Managed Care – PPO | Source: Ambulatory Visit | Attending: Nurse Practitioner | Admitting: Nurse Practitioner

## 2023-02-07 ENCOUNTER — Ambulatory Visit (INDEPENDENT_AMBULATORY_CARE_PROVIDER_SITE_OTHER): Payer: BC Managed Care – PPO | Admitting: Nurse Practitioner

## 2023-02-07 ENCOUNTER — Encounter: Payer: Self-pay | Admitting: Nurse Practitioner

## 2023-02-07 VITALS — BP 122/64 | HR 79 | Ht 62.0 in | Wt 129.0 lb

## 2023-02-07 DIAGNOSIS — M810 Age-related osteoporosis without current pathological fracture: Secondary | ICD-10-CM

## 2023-02-07 DIAGNOSIS — Z124 Encounter for screening for malignant neoplasm of cervix: Secondary | ICD-10-CM | POA: Diagnosis not present

## 2023-02-07 DIAGNOSIS — N819 Female genital prolapse, unspecified: Secondary | ICD-10-CM | POA: Diagnosis not present

## 2023-02-07 DIAGNOSIS — Z01419 Encounter for gynecological examination (general) (routine) without abnormal findings: Secondary | ICD-10-CM

## 2023-02-07 DIAGNOSIS — Z780 Asymptomatic menopausal state: Secondary | ICD-10-CM

## 2023-02-07 MED ORDER — ALENDRONATE SODIUM 70 MG PO TABS: 70.0000 mg | ORAL_TABLET | ORAL | 3 refills | Status: DC

## 2023-02-07 NOTE — Progress Notes (Addendum)
 Dawn Macdonald Feb 11, 1965 [id number]   History:  58 y.o. G 3P3 presents for annual exam. Postmenopausal - no HRT, no bleeding. Abnormal pap greater than 30 years ago. Normal mammogram history. Osteoporosis - on Fosamax , tolerating well. Started in 03/2017, stopped 12/2019, did one dose of Prolia  05/2020 but switched back to Fosamax  due to cost. EGD 12/2022 due to epigastric pain, normal. Hypothyroidism, HLD managed by PCP. Saw urogynecology last year for uterovaginal prolapse. Has had some improvement with PT. Mild urinary symptoms. Not ready for surgery at this time.   Gynecologic History No LMP recorded. Patient is postmenopausal.   Contraception: post menopausal status Sexually active: Yes  Health maintenance Last Pap: 01/02/2018. Results were: Normal neg HPV Last mammogram: 03/22/2022. Results were: Normal Last colonoscopy: 2019. Results were: Normal, 10-year recall Last Dexa: 03/21/2022. Results were: T-score -2.9 at spine (3.5 in 2019)  Past medical history, past surgical history, family history and social history were all reviewed and documented in the EPIC chart. Married. Pediatrician. 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 One is in [employer] school at [employer].   ROS:  A ROS was performed and pertinent positives and negatives are included.  Exam:  Vitals:   02/07/23 0752  BP: 122/64  Pulse: 79  SpO2: 100%  Weight: 129 lb (58.5 kg)  Height: 5' 2 (1.575 m)      Body mass index is 23.59 kg/m.  General appearance:  Normal Thyroid :  Symmetrical, normal in size, without palpable masses or nodularity. Respiratory  Auscultation:  Clear without wheezing or rhonchi Cardiovascular  Auscultation:  Regular rate, without rubs, murmurs or gallops  Edema/varicosities:  Not grossly evident Abdominal  Soft,nontender, without masses, guarding or rebound.  Liver/spleen:  No organomegaly noted  Hernia:  None appreciated  Skin  Inspection:  Grossly normal   Breasts: Examined lying and  sitting.   Right: Without masses, retractions, discharge or axillary adenopathy.   Left: Without masses, retractions, discharge or axillary adenopathy. Pelvic: External genitalia:  no lesions              Urethra:  normal appearing urethra with no masses, tenderness or lesions              Bartholins and Skenes: normal                 Vagina: normal appearing vagina with normal color and discharge, no lesions. Grade 2 rectocele, stage 2 uterovaginal prolapse              Cervix: no lesions Bimanual Exam:  Uterus:  no masses or tenderness              Adnexa: no mass, fullness, tenderness              Rectovaginal: Deferred              Anus:  normal, no lesions  Patient informed chaperone available to be present for breast and pelvic exam. Patient has requested no chaperone to be present. Patient has been advised what will be completed during breast and pelvic exam.   Assessment/Plan:  58 y.o. G3P3 for annual exam.   Well female exam with routine gynecological exam - Education provided on SBEs, importance of preventative screenings, current guidelines, high calcium diet, regular exercise, and multivitamin daily. Labs with PCP.   Postmenopausal - no HRT, no bleeding.   Age-related osteoporosis without current pathological fracture - Plan: alendronate  (FOSAMAX ) 70 MG tablet weekly. February 2024 T-score -2.9 in spine (  3.5 in 2019). On Fosamax , tolerating well. Started in 03/2017, stopped 12/2019, did one dose of Prolia  05/2020 but switched back to Fosamax  due to cost. Continue Vitamin D  + Calcium and regular exercise.   Cervical cancer screening - Plan: Cytology - PAP( [place]). Abnormal pap greater than 30 years ago.   Female genital prolapse, unspecified type. Mild urinary symptoms. Saw urogynecology last year for uterovaginal prolapse. Has had some improvement with PT. Not ready for surgery at this time. Will follow up with urogyn as needed.   Screening for breast cancer - Normal  mammogram history. Continue annual screenings. Normal breast exam today.   Screening for colon cancer - Colonoscopy in 2019. Will repeat at 10-year interval per GI's recommendation.   Return in about 1 year (around 02/07/2024) for Annual.     Dawn Macdonald, 3:53 PM 02/08/2023

## 2023-02-08 LAB — CYTOLOGY - PAP
Comment: NEGATIVE
Diagnosis: NEGATIVE
High risk HPV: NEGATIVE

## 2023-02-27 ENCOUNTER — Other Ambulatory Visit: Payer: Self-pay | Admitting: Family Medicine

## 2023-02-27 DIAGNOSIS — Z1231 Encounter for screening mammogram for malignant neoplasm of breast: Secondary | ICD-10-CM

## 2023-03-21 DIAGNOSIS — L821 Other seborrheic keratosis: Secondary | ICD-10-CM | POA: Diagnosis not present

## 2023-03-21 DIAGNOSIS — D225 Melanocytic nevi of trunk: Secondary | ICD-10-CM | POA: Diagnosis not present

## 2023-03-21 DIAGNOSIS — L814 Other melanin hyperpigmentation: Secondary | ICD-10-CM | POA: Diagnosis not present

## 2023-03-28 DIAGNOSIS — H35373 Puckering of macula, bilateral: Secondary | ICD-10-CM | POA: Diagnosis not present

## 2023-03-28 DIAGNOSIS — H401230 Low-tension glaucoma, bilateral, stage unspecified: Secondary | ICD-10-CM | POA: Diagnosis not present

## 2023-03-28 DIAGNOSIS — H35371 Puckering of macula, right eye: Secondary | ICD-10-CM | POA: Diagnosis not present

## 2023-03-28 DIAGNOSIS — H35433 Paving stone degeneration of retina, bilateral: Secondary | ICD-10-CM | POA: Diagnosis not present

## 2023-03-29 ENCOUNTER — Ambulatory Visit
Admission: RE | Admit: 2023-03-29 | Discharge: 2023-03-29 | Disposition: A | Discharge status: Home / Self Care | Payer: BC Managed Care – PPO | Source: Ambulatory Visit

## 2023-03-29 DIAGNOSIS — Z1231 Encounter for screening mammogram for malignant neoplasm of breast: Secondary | ICD-10-CM

## 2023-04-04 DIAGNOSIS — Z119 Encounter for screening for infectious and parasitic diseases, unspecified: Secondary | ICD-10-CM | POA: Diagnosis not present

## 2023-05-09 ENCOUNTER — Ambulatory Visit: Payer: BC Managed Care – PPO | Attending: Internal Medicine | Admitting: Internal Medicine

## 2023-05-09 ENCOUNTER — Encounter: Payer: Self-pay | Admitting: Internal Medicine

## 2023-05-09 VITALS — BP 100/74 | HR 66 | Ht 64.0 in | Wt 128.8 lb

## 2023-05-09 DIAGNOSIS — I10 Essential (primary) hypertension: Secondary | ICD-10-CM

## 2023-05-09 MED ORDER — METOPROLOL SUCCINATE ER 25 MG PO TB24: 25.0000 mg | ORAL_TABLET | ORAL | 3 refills | Status: DC | PRN

## 2023-05-09 NOTE — Progress Notes (Signed)
 Cardiology Office Note:    Date:  05/09/2023   ID:  Jeniah Kishi, DOB [birth year], MRN [id number]  PCP:  Richmond Campbell PA-C   [facility] Providers Cardiologist:  None     Referring MD: Richmond Campbell., PA-C   No chief complaint on file. 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 female with a hx of depression, hypothyroid (TSH 2.08), referral for palpitations. She has known vertigo, meclizine not helping.  No family hx of arrhythmia. No family hx of CAD. She drinks caffeine, but stopped. Occasional etoh. She hydrates well. 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  She notes since then, her palpitations have increased. An EKG prior showed  sinus rhythm and PVCs.  She also notes high LDL 204, TC 310. Had statin intolerance prior. CAC was zero [facility] at [facility].   Interim hx 05/10/2022 She has some palpitations, symptoms improved. She tried simvastatin and crestor. Wants to hold off on medications. No chest pain  Interim hx 05/09/2023 Taking BB as needed. Still having brief runs of SVT. No syncope.  She is overall doing well.   Past Medical History:  Diagnosis Date   Depression    Hypothyroid    Sciatica    Varicose veins     Past Surgical History:  Procedure Laterality Date   EYE SURGERY     implant tooth     TONSILLECTOMY     VARICOSE VEIN SURGERY  09/1997    Current Medications: Current Outpatient Medications on File Prior to Visit  Medication Sig Dispense Refill   alendronate (FOSAMAX) 70 MG tablet Take 1 tablet (70 mg total) by mouth once a week. Take with a full glass of water on an empty stomach. 12 tablet 3   CALCIUM PO Take 1 tablet by mouth daily.     Cholecalciferol 50 MCG (2000 UT) TABS Take by mouth.     levothyroxine (SYNTHROID) 112 MCG tablet TAKE 1 TABLET DAILY 90 tablet 3   metoprolol succinate (TOPROL-XL) 25 MG 24 hr tablet Take 25 mg by mouth as needed.      prednisoLONE acetate (PRED FORTE) 1 % ophthalmic suspension Place 1 drop into the left eye 4 (four) times daily. (Patient not taking: Reported on 05/09/2023)     No current facility-administered medications on file prior to visit.     Allergies:   Patient has no known allergies.   Social History   Socioeconomic History   Marital status: Married    Spouse name: Not on file   Number of children: Not on file   Years of education: Not on file   Highest education level: Not on file  Occupational History   Not on file  Tobacco Use   Smoking status: Former    Current packs/day: 0.00    Types: Cigarettes    Quit date: 11/02/1990    Years since quitting: 32.5   Smokeless tobacco: Never  Vaping Use   Vaping status: Never Used  Substance and Sexual Activity   Alcohol use: Yes    Comment: OCCASIONALLY   Drug use: No   Sexual activity: Yes    Birth control/protection: Post-menopausal  Other Topics Concern   Not on file  Social History Narrative   Not on file   Social Drivers of Health   Financial Resource Strain: Not on file  Food Insecurity: Not on file  Transportation Needs: Not on file  Physical Activity: Unknown (11/06/2018)   Received from [facility] visits prior to 04/01/2022., [facility] visits prior to 04/01/2022.   Exercise Vital Sign    Days of Exercise per Week: 5 days    Minutes of Exercise per Session: Not on file  Stress: Not on file  Social Connections: Not on file     Family History: The patient's family history includes Cancer in her father.  ROS:   Please see the history of present illness.     All other systems reviewed and are negative.  EKGs/Labs/Other Studies Reviewed:    The following studies were reviewed today:   EKG:  EKG is  ordered today.  The ekg ordered today demonstrates   01/18/2022- NSR  Recent Labs: No results found for requested labs within last 365 days.   Recent Lipid Panel     Component Value Date/Time   CHOL 308 (H) 12/27/2016 1107   TRIG 58 12/27/2016 1107   HDL 103 12/27/2016 1107   CHOLHDL 3.0 12/27/2016 1107   VLDL 15 12/22/2015 1026   LDLCALC 190 (H) 12/27/2016 1107     Risk Assessment/Calculations:         Physical Exam:    VS:   Vitals:   05/09/23 1403  BP: 100/74  Pulse: 66  SpO2: 97%     Wt Readings from Last 3 Encounters:  02/07/23 129 lb (58.5 kg)  05/10/22 128 lb 9.6 oz (58.3 kg)  02/01/22 123 lb 12.8 oz (56.2 kg)     GEN:  Well nourished, well developed in no acute distress HEENT: Normal NECK: No JVD LYMPHATICS: No lymphadenopathy CARDIAC: RRR, no murmurs, rubs, gallops RESPIRATORY:  Clear to auscultation without rales, wheezing or rhonchi  ABDOMEN: Soft, non-tender, non-distended MUSCULOSKELETAL:  No edema; No deformity  SKIN: Warm and dry NEUROLOGIC:  Alert and oriented x 3 PSYCHIATRIC:  Normal affect   ASSESSMENT:    Palpitations: She does not have high risk features including syncope c/f arrhythmia , family hx of SCD, or abnormalities on her EKG.  Noted recent COVID infection. We discussed scarring related to inflammation post COVID is possible. Ziopatch showed brief SVT and some PVCs. Her echo was normal. Started metop 25 mg XL daily. Can continue this  Hyperlipidemia: She had a prior CAC of zero. LDL 204, noted statin intolerance to simvastatin and crestor with myalgias. Wants to hold off on starting zetia or referral for PCSK9i. Happy to help if this changes  PLAN:    In order of problems listed above:  Refill BB Follow up 12 months with an APP      Medication Adjustments/Labs and Tests Ordered: Current medicines are reviewed at length with the patient today.  Concerns regarding medicines are outlined above.  No orders of the defined types were placed in this encounter.  No orders of the defined types were placed in this encounter.   There are no Patient Instructions on file for this visit.    Signed, Maisie Fus, MD  05/09/2023 12:27 PM    [facility]

## 2023-05-09 NOTE — Patient Instructions (Signed)
 Medication Instructions:  Your physician recommends that you continue on your current medications as directed. Please refer to the Current Medication list given to you today.  *If you need a refill on your cardiac medications before your next appointment, please call your pharmacy*  Follow-Up: At [facility], you and your health needs are our priority.  As part of our continuing mission to provide you with exceptional heart care, our providers are all part of one team.  This team includes your primary Cardiologist (physician) and Advanced Practice Providers or APPs (Physician Assistants and Nurse Practitioners) who all work together to provide you with the care you need, when you need it.  Your next appointment:   12 month(s)  Provider:   Any APP   Other Instructions    1st Floor: - Lobby - Registration  - Pharmacy  - Lab - Cafe  2nd Floor: - PV Lab - Diagnostic Testing (echo, CT, nuclear med)  3rd Floor: - Vacant  4th Floor: - TCTS (cardiothoracic surgery) - AFib Clinic - Structural Heart Clinic - Vascular Surgery  - Vascular Ultrasound  5th Floor: - HeartCare Cardiology (general and EP) - Clinical Pharmacy for coumadin, hypertension, lipid, weight-loss medications, and med management appointments    Valet parking services will be available as well.

## 2023-08-04 DIAGNOSIS — M654 Radial styloid tenosynovitis [de Quervain]: Secondary | ICD-10-CM | POA: Diagnosis not present

## 2023-09-26 DIAGNOSIS — H47021 Hemorrhage in optic nerve sheath, right eye: Secondary | ICD-10-CM | POA: Diagnosis not present

## 2023-09-26 DIAGNOSIS — H401230 Low-tension glaucoma, bilateral, stage unspecified: Secondary | ICD-10-CM | POA: Diagnosis not present

## 2023-11-07 DIAGNOSIS — H401230 Low-tension glaucoma, bilateral, stage unspecified: Secondary | ICD-10-CM | POA: Diagnosis not present

## 2024-01-01 ENCOUNTER — Telehealth: Payer: Self-pay | Admitting: *Deleted

## 2024-01-01 DIAGNOSIS — M810 Age-related osteoporosis without current pathological fracture: Secondary | ICD-10-CM

## 2024-01-01 NOTE — Telephone Encounter (Signed)
 Agree  Thank you

## 2024-01-01 NOTE — Telephone Encounter (Signed)
 Patient left message requesting BMD order, received recall letter for 2026.   Last BMD 03/21/22, osteoporosis, Tscore -2.9, on Fosamax , repeat 2 yrs  AEX 02/07/23 -TW  Spoke with patient, reviewed imaging location options, order placed for BMD at [facility], to be scheduled 03/2024. Number provided to patient to call and schedule.   Routing to provider for final review. Patient is agreeable to disposition. Will close encounter.

## 2024-01-02 DIAGNOSIS — E039 Hypothyroidism, unspecified: Secondary | ICD-10-CM | POA: Diagnosis not present

## 2024-01-02 DIAGNOSIS — Z0000 Encounter for general adult medical examination without abnormal findings: Secondary | ICD-10-CM | POA: Diagnosis not present

## 2024-01-02 DIAGNOSIS — E559 Vitamin D deficiency, unspecified: Secondary | ICD-10-CM | POA: Diagnosis not present

## 2024-01-09 DIAGNOSIS — Z8679 Personal history of other diseases of the circulatory system: Secondary | ICD-10-CM | POA: Diagnosis not present

## 2024-01-09 DIAGNOSIS — E039 Hypothyroidism, unspecified: Secondary | ICD-10-CM | POA: Diagnosis not present

## 2024-01-09 DIAGNOSIS — Z23 Encounter for immunization: Secondary | ICD-10-CM | POA: Diagnosis not present

## 2024-01-09 DIAGNOSIS — Z0000 Encounter for general adult medical examination without abnormal findings: Secondary | ICD-10-CM | POA: Diagnosis not present

## 2024-01-09 DIAGNOSIS — M818 Other osteoporosis without current pathological fracture: Secondary | ICD-10-CM | POA: Diagnosis not present

## 2024-01-16 DIAGNOSIS — H401230 Low-tension glaucoma, bilateral, stage unspecified: Secondary | ICD-10-CM | POA: Diagnosis not present

## 2024-02-13 ENCOUNTER — Ambulatory Visit: Payer: BC Managed Care – PPO | Admitting: Nurse Practitioner

## 2024-02-18 ENCOUNTER — Other Ambulatory Visit: Payer: Self-pay | Admitting: Nurse Practitioner

## 2024-02-18 DIAGNOSIS — M810 Age-related osteoporosis without current pathological fracture: Secondary | ICD-10-CM

## 2024-02-18 NOTE — Telephone Encounter (Signed)
 Med refill request: Alendronate  (fosamax ) 70 mg tablet Last AEX: 02/07/23 TW Next AEX: 02/28/24 TW Last MMG (if hormonal med) Refill authorized: Please Advise?  Last Rx sent #[id number] with 3 refills on 02/07/23 TW

## 2024-02-27 NOTE — Progress Notes (Unsigned)
 "  Dawn Macdonald 06/27/1965 [id number]   History:  59 y.o. G 3P3 presents for annual exam. Postmenopausal - no HRT, no bleeding. Abnormal pap greater than 30 years ago. Osteoporosis - on Fosamax , tolerating well. Started in 03/2017, stopped 12/2019, did one dose of Prolia  05/2020 but switched back to Fosamax  due to cost. EGD 12/2022 due to epigastric pain, normal. Hypothyroidism, HLD managed by PCP. Has seen urogynecology for uterovaginal prolapse, had some improvement with PT. Considering surgery.   Gynecologic History No LMP recorded. Patient is postmenopausal.   Contraception: post menopausal status Sexually active: Yes  Health maintenance Last Pap: 02/07/2023. Results were: Normal neg HPV Last mammogram: 03/29/2023. Results were: Normal Last colonoscopy: 2019. Results were: Normal, 10-year recall Last Dexa: 03/21/2022. Results were: T-score -2.9 at spine (3.5 in 2019)     02/28/2024    7:40 AM  Depression screen PHQ 2/9  Decreased Interest 0  Down, Depressed, Hopeless 0  PHQ - 2 Score 0     Past medical history, past surgical history, family history and social history were all reviewed and documented in the EPIC chart. Married. Pediatrician. 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 One just graduated from [employer] school, interested in [employer].   ROS:  A ROS was performed and pertinent positives and negatives are included.  Exam:  Vitals:   02/28/24 0736  BP: 120/78  Pulse: 72  SpO2: 99%  Weight: 126 lb (57.2 kg)  Height: 5' 1.75 (1.568 m)       Body mass index is 23.23 kg/m.  General appearance:  Normal Thyroid :  Symmetrical, normal in size, without palpable masses or nodularity. Respiratory  Auscultation:  Clear without wheezing or rhonchi Cardiovascular  Auscultation:  Regular rate, without rubs, murmurs or gallops  Edema/varicosities:  Not grossly evident Abdominal  Soft,nontender, without masses, guarding or rebound.  Liver/spleen:  No organomegaly noted  Hernia:   None appreciated  Skin  Inspection:  Grossly normal   Breasts: Examined lying and sitting.   Right: Without masses, retractions, discharge or axillary adenopathy.   Left: Without masses, retractions, discharge or axillary adenopathy. Pelvic: External genitalia:  no lesions              Urethra:  normal appearing urethra with no masses, tenderness or lesions              Bartholins and Skenes: normal                 Vagina: normal appearing vagina with normal color and discharge, no lesions. Grade 2 rectocele, stage 2 uterovaginal prolapse. Atrophic changes.               Cervix: no lesions Bimanual Exam:  Uterus:  no masses or tenderness              Adnexa: no mass, fullness, tenderness              Rectovaginal: Deferred              Anus:  normal, no lesions  Zada Louder, CMA present as chaperone.   Assessment/Plan:  59 y.o. G3P3 for annual exam.   Well female exam with routine gynecological exam - Education provided on SBEs, importance of preventative screenings, current guidelines, high calcium diet, regular exercise, and multivitamin daily. Labs with PCP.   Postmenopausal - no HRT, no bleeding.   Depression screening - PHQ - 0  Age-related osteoporosis without current pathological fracture - Plan: alendronate  (FOSAMAX ) 70 MG tablet  weekly. February 2024 T-score -2.9 in spine (3.5 in 2019). On Fosamax , tolerating well. Started in 03/2017, stopped 12/2019, did one dose of Prolia  05/2020 but switched back to Fosamax  due to cost. Continue Vitamin D  + Calcium and regular exercise. DXA scheduled in May.   Cervical cancer screening - Abnormal pap greater than 30 years ago. Will repeat at 5-year interval per guidelines.   Female genital prolapse, unspecified type. Mild urinary symptoms. Saw urogynecology last year for uterovaginal prolapse. Has had some improvement with PT. Considering surgery now.   Screening for breast cancer - Normal mammogram history. Continue annual screenings.  Normal breast exam today.   Screening for colon cancer - Colonoscopy in 2019. Will repeat at 10-year interval per GI's recommendation.   Return in about 1 year (around 02/27/2025) for Annual.     Dawn Macdonald [facility]), 8:06 AM 02/28/2024 "

## 2024-02-28 ENCOUNTER — Ambulatory Visit: Admitting: Nurse Practitioner

## 2024-02-28 ENCOUNTER — Encounter: Payer: Self-pay | Admitting: Nurse Practitioner

## 2024-02-28 VITALS — BP 120/78 | HR 72 | Ht 61.75 in | Wt 126.0 lb

## 2024-02-28 DIAGNOSIS — Z780 Asymptomatic menopausal state: Secondary | ICD-10-CM | POA: Diagnosis not present

## 2024-02-28 DIAGNOSIS — M810 Age-related osteoporosis without current pathological fracture: Secondary | ICD-10-CM

## 2024-02-28 DIAGNOSIS — Z01419 Encounter for gynecological examination (general) (routine) without abnormal findings: Secondary | ICD-10-CM

## 2024-02-28 DIAGNOSIS — N819 Female genital prolapse, unspecified: Secondary | ICD-10-CM

## 2024-02-28 DIAGNOSIS — Z1331 Encounter for screening for depression: Secondary | ICD-10-CM

## 2024-02-28 MED ORDER — ALENDRONATE SODIUM 70 MG PO TABS: 70.0000 mg | ORAL_TABLET | ORAL | 3 refills | Status: AC

## 2024-03-06 ENCOUNTER — Other Ambulatory Visit: Payer: Self-pay | Admitting: Family Medicine

## 2024-03-06 DIAGNOSIS — Z1231 Encounter for screening mammogram for malignant neoplasm of breast: Secondary | ICD-10-CM

## 2024-04-02 ENCOUNTER — Encounter

## 2024-04-02 DIAGNOSIS — Z1231 Encounter for screening mammogram for malignant neoplasm of breast: Secondary | ICD-10-CM

## 2024-06-11 ENCOUNTER — Other Ambulatory Visit (HOSPITAL_BASED_OUTPATIENT_CLINIC_OR_DEPARTMENT_OTHER)

## 2025-03-04 ENCOUNTER — Ambulatory Visit: Admitting: Nurse Practitioner
